# Patient Record
Sex: Female | Born: 1985 | Race: Black or African American | Hispanic: No | Marital: Single | Smoking: Former smoker
Health system: Southern US, Community
[De-identification: ages and names within clinical notes are randomized; demographics above are authoritative.]

## PROBLEM LIST (undated history)

## (undated) DIAGNOSIS — Z789 Other specified health status: Secondary | ICD-10-CM

## (undated) DIAGNOSIS — F419 Anxiety disorder, unspecified: Secondary | ICD-10-CM

## (undated) DIAGNOSIS — T7840XA Allergy, unspecified, initial encounter: Secondary | ICD-10-CM

## (undated) DIAGNOSIS — F32A Depression, unspecified: Secondary | ICD-10-CM

## (undated) HISTORY — DX: Depression, unspecified: F32.A

## (undated) HISTORY — PX: OTHER SURGICAL HISTORY: SHX169

## (undated) HISTORY — DX: Other specified health status: Z78.9

## (undated) HISTORY — DX: Allergy, unspecified, initial encounter: T78.40XA

## (undated) HISTORY — DX: Anxiety disorder, unspecified: F41.9

---
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
## 2005-03-27 ENCOUNTER — Inpatient Hospital Stay: Payer: Self-pay

## 2005-04-19 ENCOUNTER — Emergency Department: Payer: Self-pay | Admitting: Emergency Medicine

## 2005-05-25 ENCOUNTER — Observation Stay: Payer: Self-pay

## 2005-05-28 ENCOUNTER — Inpatient Hospital Stay: Payer: Self-pay

## 2005-12-10 ENCOUNTER — Emergency Department: Payer: Self-pay | Admitting: Emergency Medicine

## 2006-06-25 ENCOUNTER — Emergency Department: Payer: Self-pay | Admitting: Emergency Medicine

## 2007-03-28 ENCOUNTER — Emergency Department: Payer: Self-pay | Admitting: Emergency Medicine

## 2007-10-19 ENCOUNTER — Emergency Department: Payer: Self-pay | Admitting: Emergency Medicine

## 2008-03-21 ENCOUNTER — Emergency Department: Payer: Self-pay | Admitting: Emergency Medicine

## 2008-05-20 ENCOUNTER — Emergency Department: Payer: Self-pay | Admitting: Emergency Medicine

## 2011-04-16 ENCOUNTER — Emergency Department: Payer: Self-pay | Admitting: Emergency Medicine

## 2012-02-23 ENCOUNTER — Emergency Department: Payer: Self-pay | Admitting: *Deleted

## 2013-08-15 HISTORY — PX: EXCISION VAGINAL CYST: SHX5825

## 2014-03-21 DIAGNOSIS — N907 Vulvar cyst: Secondary | ICD-10-CM | POA: Insufficient documentation

## 2014-04-26 ENCOUNTER — Inpatient Hospital Stay: Payer: Self-pay | Admitting: Obstetrics and Gynecology

## 2014-04-26 LAB — CBC WITH DIFFERENTIAL/PLATELET
BASOS ABS: 0 10*3/uL (ref 0.0–0.1)
Basophil %: 0.6 %
Eosinophil #: 0.1 10*3/uL (ref 0.0–0.7)
Eosinophil %: 1.1 %
HCT: 30.3 % — ABNORMAL LOW (ref 35.0–47.0)
HGB: 9.7 g/dL — ABNORMAL LOW (ref 12.0–16.0)
LYMPHS PCT: 15.9 %
Lymphocyte #: 1.1 10*3/uL (ref 1.0–3.6)
MCH: 24.1 pg — AB (ref 26.0–34.0)
MCHC: 32.1 g/dL (ref 32.0–36.0)
MCV: 75 fL — ABNORMAL LOW (ref 80–100)
MONO ABS: 0.7 x10 3/mm (ref 0.2–0.9)
Monocyte %: 10.4 %
NEUTROS ABS: 5.1 10*3/uL (ref 1.4–6.5)
Neutrophil %: 72 %
Platelet: 284 10*3/uL (ref 150–440)
RBC: 4.04 10*6/uL (ref 3.80–5.20)
RDW: 14.3 % (ref 11.5–14.5)
WBC: 7 10*3/uL (ref 3.6–11.0)

## 2014-04-28 LAB — HEMATOCRIT: HCT: 27.3 % — AB (ref 35.0–47.0)

## 2014-07-16 DIAGNOSIS — Z862 Personal history of diseases of the blood and blood-forming organs and certain disorders involving the immune mechanism: Secondary | ICD-10-CM | POA: Insufficient documentation

## 2014-12-23 NOTE — H&P (Signed)
L&D Evaluation:  History:  HPI Pt is a 29 yo G3P2002 at 40.[redacted] weeks GA with an EDC of 04/21/14 who presents to L&D for IOL for a late term pregnancy. She reports +FM, denies vb or lof. Her pregnancy course is significant for obesity, MJ use until a + HPT, vulvar cyst that was removed in pregnancy, anemia, and frequent yeast infections. She is O+, VI, RI, Tdap given, and GBS positive.   Presents with other, IOL   Patient's Medical History obesity, anemia, PIH with G1   Patient's Surgical History other  arm surgery   Medications Pre Natal Vitamins  Iron   Allergies azithromycin   Social History MJ use prior to +HPT   Family History Non-Contributory   ROS:  ROS All systems were reviewed.  HEENT, CNS, GI, GU, Respiratory, CV, Renal and Musculoskeletal systems were found to be normal.   Exam:  Vital Signs stable   General no apparent distress   Mental Status clear   Chest clear   Heart normal sinus rhythm   Abdomen gravid, non-tender   Pelvic 3/75-80/-2   Mebranes Intact   FHT normal rate with no decels, 140's, +accels, no decels   Ucx regular, every 4 minutes   Skin dry, no lesions, no rashes   Lymph no lymphadenopathy   Impression:  Impression reactive NST, IOL for late term at 40.6 weeks   Plan:  Plan EFM/NST, antibiotics for GBBS prophylaxis, pitocin, anticipate svd   Follow Up Appointment need to schedule. in 6 weeks   Electronic Signatures: Breleigh Carpino, AnimatorCourtney (CNM)  (Signed 13-Sep-15 09:33)  Authored: L&D Evaluation   Last Updated: 13-Sep-15 09:33 by Jannet MantisSubudhi, Taliesin Hartlage (CNM)

## 2016-04-14 LAB — LIPID PANEL
CHOLESTEROL, TOTAL: 115 (ref 100–199)
HDL: 52 (ref 39–39)
LDL (calc): 56 (ref 0–99)
TRIGLYCERIDES: 36 (ref 0–149)
Total CHOL/HDL Ratio: 2.2 (ref 0.0–4.4)
VLDL: 7 mg/dL (ref 5–40)

## 2016-04-14 LAB — GLUCOSE, FASTING: Glucose: 84 (ref 65–99)

## 2016-04-14 LAB — CREATININE WITH EST GFR
Creatine, Serum: 0.8 (ref 0.57–1.00)
EGFR (African American): 114 (ref <59)
GFR CALC NON AF AMER: 99 (ref 59–59)

## 2016-04-14 LAB — HEMOGLOBIN A1C: Hemoglobin A1C: 5.2 (ref 4.8–5.6)

## 2016-06-01 ENCOUNTER — Encounter: Payer: Self-pay | Admitting: *Deleted

## 2016-06-01 ENCOUNTER — Ambulatory Visit (INDEPENDENT_AMBULATORY_CARE_PROVIDER_SITE_OTHER): Payer: 59 | Admitting: Family Medicine

## 2016-06-01 ENCOUNTER — Encounter: Payer: Self-pay | Admitting: Family Medicine

## 2016-06-01 ENCOUNTER — Encounter (INDEPENDENT_AMBULATORY_CARE_PROVIDER_SITE_OTHER): Payer: Self-pay

## 2016-06-01 VITALS — BP 121/68 | HR 80 | Temp 98.5°F | Resp 16 | Ht 66.0 in | Wt 200.0 lb

## 2016-06-01 DIAGNOSIS — Z720 Tobacco use: Secondary | ICD-10-CM | POA: Diagnosis not present

## 2016-06-01 DIAGNOSIS — Z862 Personal history of diseases of the blood and blood-forming organs and certain disorders involving the immune mechanism: Secondary | ICD-10-CM

## 2016-06-01 DIAGNOSIS — Z7689 Persons encountering health services in other specified circumstances: Secondary | ICD-10-CM | POA: Diagnosis not present

## 2016-06-01 DIAGNOSIS — J3089 Other allergic rhinitis: Secondary | ICD-10-CM | POA: Insufficient documentation

## 2016-06-01 DIAGNOSIS — N907 Vulvar cyst: Secondary | ICD-10-CM

## 2016-06-01 DIAGNOSIS — Z975 Presence of (intrauterine) contraceptive device: Secondary | ICD-10-CM | POA: Diagnosis not present

## 2016-06-01 DIAGNOSIS — Z716 Tobacco abuse counseling: Secondary | ICD-10-CM | POA: Diagnosis not present

## 2016-06-01 DIAGNOSIS — E669 Obesity, unspecified: Secondary | ICD-10-CM | POA: Diagnosis not present

## 2016-06-01 NOTE — Assessment & Plan Note (Signed)
Stable, controlled on claritin PRN

## 2016-06-01 NOTE — Assessment & Plan Note (Signed)
Stable, intermittent menstrual cycles. Interested in replacing Mirena IUD when due in 05/2019, advised to notify our office in advance for GYN referral

## 2016-06-01 NOTE — Patient Instructions (Addendum)
Thank you for coming in to clinic today.  1. Reminder for exercise and diet plan - Exercise 5 x days a week, 30 min, both cardio and strength - Diet with smaller frequent meals, higher protein and low carb, inc vegetables/fruits  2. Smoking Cessation plan - Taper down by 1 cig each week until off - 1 [id number], if want to speak to therapist and smoking counselor  Reminder TDap tetanus shot due in 2025 Mirena due to be swapped 05/2019 - will need referral to GYN (let us know few months in advance)  Please schedule a follow-up appointment with Dr. Althea CharonKaramalegos in 1 year for Annual Physical + Pap Smear, you may bring labs again.  If you have any other questions or concerns, please feel free to call the clinic or send a message through MyChart. You may also schedule an earlier appointment if necessary.  Saralyn PilarAlexander Karamalegos, DO [facility]

## 2016-06-01 NOTE — Progress Notes (Signed)
Subjective:    Patient ID: Margaret Potter, female    DOB: 02/09/1986, 30 y.o.   MRN: [id number]  Margaret Potter is a 30 y.o. female presenting on 06/01/2016 for Establish Care  Previously established for brief time at [facility] and [facility].  HPI  OBESITY BMI 31-32 - Reports chronic history of overweight, several years ago she weighed up to 300 lbs by report, intensive lifestyle modifications with diet / exercise, she was able to lose 110 lbs, down to 190 lbs. Following her most recent pregnancy, delivery 04/2014, she ultimately gained about 10 lbs back post-pregnancy, now around wt 200 lb - Today presents with failure of [facility] BMI biometric screening on 04/14/2016, had labs drawn, and BMI >30, she is interested in resuming lifestyle modifications to lose and maintain healthy weight - Interested to switch back to diet to smaller frequent meals, low carb, adding more vegetables/fruit to diet - She is ready to resume regular exercise 5 x week, 30 min, combination of cardio strength - Additional biometric measurements, height 66.5 inch, weight 200.0 lbs, BMI 31.8, Waist 33.5 in, BP 126/74  TOBACCO USE: - Active smoker, intermittently over past 5 years, now trying to quit. She feels ready to quit now - Currently about 4-5 cigs daily, and plans to self quit using taper method down by 1 cig daily per week, over about 4 weeks until quit. Not interested in NRT or medications. Has quit before smoke free for up to 2 years. - Admits to smoking as primary stress reliever. Does not like smell and taste - Failed [facility] biometrics for active smoker  H/o Anemia in pregnancy, Resolved - Reports prior history of some low blood counts with dx anemia during last pregnancy, reviewed chart with mild low Hgb in 01/2014 and resolution with normal Hgb in 07/2014 - Does not take any iron supplement  Seasonal and Environmental Allergies: - Chronic mild problem by report, does well with  intermittent claritin PRN   Contraception / OB GYN - G3P3, all NSVD. Did have complication with gestational HTN during 1st pregnancy only - Mirena IUD placed 05/2014 (s/p last pregnancy delivery 04/2014), will be due for replacement in 2020 - Controls menstrual cycles with infrequent bleeding small amount q 2-4 months  Health Maintenance: - Last pap smear, 09/2013 (normal), had abnormal pap x 1 (in 2012, had colposcopy and cervix biopsy, negative) - UTD on Flu shot, TDap 04/27/2014, next due 2025  History reviewed. No pertinent past medical history. Social History   Social History  . Marital status: Single    Spouse name: N/A  . Number of children: 3  . Years of education: College   Occupational History  . [employer] Employee    Social History Main Topics  . Smoking status: Current Every Day Smoker    Packs/day: 0.40    Years: 5.00    Types: Cigarettes  . Smokeless tobacco: Never Used     Comment: Quit prior 2 years, without treatment  . Alcohol use No  . Drug use: No  . Sexual activity: Yes    Birth control/ protection: IUD   Other Topics Concern  . Not on file   Social History Narrative  . No narrative on file   Family History  Problem Relation Age of Onset  . Arthritis Mother   . Cancer Mother   . Depression Mother   . Diabetes Mother   . Heart disease Mother   . Hyperlipidemia Mother   . Hypertension Mother   .  Stroke Mother   . Depression Father   . Ulcers Father   . Depression Brother   . Arthritis Maternal Grandmother   . Asthma Maternal Grandmother   . Cancer Maternal Grandmother   . Depression Maternal Grandmother   . Diabetes Maternal Grandmother   . Heart disease Maternal Grandmother   . Hypertension Maternal Grandmother   . Stroke Maternal Grandmother   . Vision loss Maternal Grandmother   . Arthritis Maternal Grandfather   . Alcohol abuse Maternal Grandfather   . Cancer Maternal Grandfather   . Hypertension Maternal Grandfather   . Stroke  Maternal Grandfather   . Heart attack Maternal Grandfather   . Arthritis Paternal Grandmother   . Diabetes Paternal Grandmother   . Hypertension Paternal Grandmother   . Vision loss Paternal Grandmother    No current outpatient prescriptions on file prior to visit.   No current facility-administered medications on file prior to visit.    Past Surgical History:  Procedure Laterality Date  . arm surgery Right    Question if hardware retained, unsure childhood surgery  . EXCISION VAGINAL CYST N/A 2015   Midline, chronic problem (inclusion cyst s/p prior suture repair from pregnancy)    Review of Systems  Constitutional: Negative for activity change, appetite change, chills, diaphoresis, fatigue, fever and unexpected weight change.  HENT: Negative for congestion, hearing loss and sinus pressure.   Eyes: Negative for visual disturbance.  Respiratory: Negative for cough, chest tightness and shortness of breath.   Cardiovascular: Negative for chest pain, palpitations and leg swelling.  Gastrointestinal: Negative for abdominal pain, constipation, diarrhea, nausea and vomiting.  Endocrine: Negative for cold intolerance, polydipsia and polyuria.  Genitourinary: Negative for dysuria, frequency and menstrual problem.  Musculoskeletal: Negative for arthralgias and neck pain.  Skin: Negative for rash.  Allergic/Immunologic: Positive for environmental allergies.  Neurological: Negative for dizziness, weakness, light-headedness, numbness and headaches.  Hematological: Negative for adenopathy.  Psychiatric/Behavioral: Negative for behavioral problems, dysphoric mood and sleep disturbance.   Per HPI unless specifically indicated above     Objective:    BP 121/68 (BP Location: Right Arm, Patient Position: Sitting, Cuff Size: Large)   Pulse 80   Temp 98.5 F (36.9 C) (Oral)   Resp 16   Ht _0  (1.676 m)   Wt 200 lb (90.7 kg)   BMI 32.28 kg/m   Wt Readings from Last 3 Encounters:    06/01/16 200 lb (90.7 kg)    Physical Exam  Constitutional: She is oriented to person, place, and time. She appears well-developed and well-nourished. No distress.  Well-appearing, comfortable, cooperative  HENT:  Head: Normocephalic and atraumatic.  Mouth/Throat: Oropharynx is clear and moist.  Eyes: Conjunctivae and EOM are normal. Pupils are equal, round, and reactive to light.  Neck: Normal range of motion. Neck supple. No thyromegaly present.  Cardiovascular: Normal rate, regular rhythm, normal heart sounds and intact distal pulses.   No murmur heard. Pulmonary/Chest: Effort normal and breath sounds normal. No respiratory distress. She has no wheezes. She has no rales.  Abdominal: Soft. She exhibits no distension.  Musculoskeletal: Normal range of motion. She exhibits no edema or tenderness.  Back normal without deformity or abnormal curvature.  Upper / Lower Extremities: - Normal muscle tone, strength bilateral upper extremities 5/5, lower extremities 5/5 - Bilateral shoulders, knees, wrist, ankles without deformity, tenderness, effusion - Normal Gait  Right Thumb No deformity of joint, erythema, edema, or ecchymosis. Non-tender to palpation. Negative Finklestein's test for tendinitis. Grip normal.  Lymphadenopathy:    She has no cervical adenopathy.  Neurological: She is alert and oriented to person, place, and time.  Skin: Skin is warm and dry. No rash noted. She is not diaphoretic.  Psychiatric: She has a normal mood and affect. Her behavior is normal.  Nursing note and vitals reviewed.  Reviewed results in Care Everywhere from 01/2014 and 07/2014 - During most recent pregnancy - 01/2014 with CBC - WBC 6.5, Hgb 10.5, HCT 31.5, MCV 83 - 07/2014 with CBC - WBC 5.9, Hgb 12.7, HCT 39, MCV 83  Recent Results (from the past 2160 hour(s))  Lipid panel     Status: None   Collection Time: 04/14/16 12:00 AM  Result Value Ref Range   Cholesterol, Total 115 100 - 199    Triglycerides 36 0 - 149   HDL 52 <39   VLDL 7 5 - 40 mg/dL   LDL (calc) 56 0 - 99   Total CHOL/HDL Ratio 2.2 0.0 - 4.4  Hemoglobin A1c     Status: None   Collection Time: 04/14/16 12:00 AM  Result Value Ref Range   Hemoglobin A1C 5.2 4.8 - 5.6  Creatinine with Est GFR     Status: None   Collection Time: 04/14/16 12:00 AM  Result Value Ref Range   Creatine, Serum 0.80 0.57 - 1.00   EGFR (Non-African Amer.) 99 <59   EGFR (African American) 114 <59  Glucose, fasting     Status: None   Collection Time: 04/14/16 12:00 AM  Result Value Ref Range   Glucose 84 65 - 99        Assessment & Plan:   Problem List Items Addressed This Visit    RESOLVED: Vulvar cyst   Tobacco use    Active smoker, 4-5 cigs daily. Prior quit for up to 2 years. Ready to quit. Start self taper plan 1 cig down weekly for 4 weeks to quit. Offered cessation resources, [place] QUITLINE Follow-up as needed. Completed [facility] Biometric paperwork, returned to patient.      Obesity (BMI 30.0-34.9)    BMI 31-32, without personal history of other risk factors, some fam history with DM, HTN, HLD. Prior significant weight gain up to 300 lb, was able to lose 110 lbs w/ diet+exercise - A1c 5.2, lipids completely normal  Plan: 1. Failed [facility] biometric screening, goal BMI < 30, discussed mutual plan to start Lifestyle modifications with diet smaller frequent meals, low carb, inc veg/fruit. Execise regular 5 x weekly, 30 min, cardio + strength. Completed paperwork and returned to patient for submission to [facility] 2. Future monitor labs per [facility] with routine yearly biometrics, if other abnormality consider adding chemistry 3. Follow-up 1 year      History of anemia    Resolved. Asymptomatic Initial dx during pregnancy only 01/2014, resolved on re-check postpartum 07/2014. Now on IUD, without menorrhagia Continue regular diet. No iron supplement at this time. Follow-up in future as needed      Environmental and  seasonal allergies    Stable, controlled on claritin PRN      Contraception, device intrauterine    Stable, intermittent menstrual cycles. Interested in replacing [place] IUD when due in 05/2019, advised to notify our office in advance for GYN referral       Other Visit Diagnoses    Encounter to establish care with new doctor    -  Primary   Encounter for smoking cessation counseling          Meds ordered  this encounter  Medications  . ibuprofen (ADVIL,MOTRIN) 200 MG tablet    Sig: Take 200-400 mg by mouth every 6 (six) hours as needed for headache or cramping.  . loratadine (CLARITIN) 10 MG tablet    Sig: Take 10 mg by mouth daily as needed for allergies.      Follow up plan: Return in about 1 year (around 06/01/2017) for annual physical.  Nobie Putnam, [facility] 06/01/2016, 3:39 PM

## 2016-06-01 NOTE — Assessment & Plan Note (Signed)
Resolved. Asymptomatic Initial dx during pregnancy only 01/2014, resolved on re-check postpartum 07/2014. Now on IUD, without menorrhagia Continue regular diet. No iron supplement at this time. Follow-up in future as needed

## 2016-06-01 NOTE — Assessment & Plan Note (Signed)
Active smoker, 4-5 cigs daily. Prior quit for up to 2 years. Ready to quit. Start self taper plan 1 cig down weekly for 4 weeks to quit. Offered cessation resources, [place] QUITLINE Follow-up as needed. Completed [facility] Biometric paperwork, returned to patient.

## 2016-06-01 NOTE — Assessment & Plan Note (Signed)
BMI 31-32, without personal history of other risk factors, some fam history with DM, HTN, HLD. Prior significant weight gain up to 300 lb, was able to lose 110 lbs w/ diet+exercise - A1c 5.2, lipids completely normal  Plan: 1. Failed [facility] biometric screening, goal BMI < 30, discussed mutual plan to start Lifestyle modifications with diet smaller frequent meals, low carb, inc veg/fruit. Execise regular 5 x weekly, 30 min, cardio + strength. Completed paperwork and returned to patient for submission to [facility] 2. Future monitor labs per [facility] with routine yearly biometrics, if other abnormality consider adding chemistry 3. Follow-up 1 year

## 2017-01-19 ENCOUNTER — Ambulatory Visit (INDEPENDENT_AMBULATORY_CARE_PROVIDER_SITE_OTHER): Payer: Managed Care, Other (non HMO) | Admitting: Nurse Practitioner

## 2017-01-19 ENCOUNTER — Encounter: Payer: Self-pay | Admitting: Nurse Practitioner

## 2017-01-19 VITALS — BP 112/58 | HR 93 | Temp 98.8°F | Ht 65.5 in | Wt 208.2 lb

## 2017-01-19 DIAGNOSIS — J029 Acute pharyngitis, unspecified: Secondary | ICD-10-CM

## 2017-01-19 DIAGNOSIS — J019 Acute sinusitis, unspecified: Secondary | ICD-10-CM | POA: Diagnosis not present

## 2017-01-19 LAB — POCT RAPID STREP A (OFFICE): Rapid Strep A Screen: NEGATIVE

## 2017-01-19 MED ORDER — FLUTICASONE PROPIONATE 50 MCG/ACT NA SUSP: 2.0000 | Freq: Every day | NASAL | 6 refills | Status: DC

## 2017-01-19 MED ORDER — IPRATROPIUM BROMIDE 0.06 % NA SOLN: 2.0000 | Freq: Four times a day (QID) | NASAL | 0 refills | Status: DC

## 2017-01-19 MED ORDER — AMOXICILLIN 500 MG PO TABS: 500.0000 mg | ORAL_TABLET | Freq: Two times a day (BID) | ORAL | 0 refills | Status: AC

## 2017-01-19 NOTE — Patient Instructions (Addendum)
Shaquasha, Thank you for coming in to clinic today.  1. You likely have sinusitis. - START taking amoxicillin 500 mg twice daily (about every 12 hours) if you still feel this bad tomorrow or worse. -   It may be viral so please wait to start your antibiotic. - Start Atrovent nasal spray decongestant 2 sprays each nostril up to 4 times daily for 5-7 days - Continue anti-histamine loratadine 10mg  daily - also can use Flonase 2 sprays each nostril daily for up to 4-6 weeks - If congestion is worse, start OTC Mucinex (or may try Mucinex-DM for cough) up to 7-10 days then stop - Drink plenty of fluids to improve congestion - You may try over the counter Nasal Saline spray (Simply Saline, Ocean Spray) as needed to reduce congestion. - Drink warm herbal tea with honey for sore throat or sore throat lozenges. - Start taking Tylenol extra strength 1 to 2 tablets every 6-8 hours for aches or fever/chills for next few days as needed.  Do not take more than 3,000 mg in 24 hours from all medicines.  May take Ibuprofen as well if tolerated 200-400mg  every 8 hours as needed.  Please schedule a follow-up appointment with Wilhelmina McardleLauren Jaiana Sheffer, AGNP to Return 5-7 days if symptoms worsen or fail to improve.  If you have any other questions or concerns, please feel free to call the clinic or send a message through MyChart. You may also schedule an earlier appointment if necessary.  Wilhelmina McardleLauren Gavrielle Streck, DNP, AGNP-BC Adult Gerontology Nurse Practitioner [facility], [facility]

## 2017-01-19 NOTE — Progress Notes (Signed)
I have reviewed this encounter including the documentation in this note and/or discussed this patient with the provider, Wilhelmina McardleLauren Kennedy, AGPCNP-BC. I am certifying that I agree with the content of this note as supervising physician.  Saralyn PilarAlexander Keymari Sato, DO [facility] 01/19/2017, 5:44 PM

## 2017-01-19 NOTE — Progress Notes (Signed)
Subjective:    Patient ID: Margaret Potter, female    DOB: Mar 08, 1986, 31 y.o.   MRN: [id number]  Margaret Potter is a 31 y.o. female presenting on 01/19/2017 for Sinusitis (sore throat, hoarsness, sinus pressure, back aches, sweats, and leg aches x 2-3 days )   HPI  URI symptoms Symptom onset was Monday with drainage and took allergy medicine.  Tuesday worsening w/ ongoing worsening of symptoms each day.  Current symptoms include sore throat, hoarseness, hip/low back/leg aches, sweats that come and go, and malaise.  Last night she had a night sweat so bad she had to take a shower.  No tooth/jaw pain, ear pressure or fullness today, nausea, vomiting, diarrhea, or constipation.  She does note once sick contact of close family member.  Social History  Substance Use Topics  . Smoking status: Current Every Day Smoker    Packs/day: 0.25    Years: 5.00    Types: Cigarettes  . Smokeless tobacco: Never Used     Comment: Quit prior 2 years, without treatment  . Alcohol use No    Review of Systems Per HPI unless specifically indicated above     Objective:    BP (!) 112/58 (BP Location: Left Arm, Patient Position: Sitting, Cuff Size: Normal)   Pulse 93   Temp 98.8 F (37.1 C) (Oral)   Ht 5' 5.5" (1.664 m)   Wt 208 lb 3.2 oz (94.4 kg)   LMP 01/12/2017   SpO2 100%   BMI 34.12 kg/m    Wt Readings from Last 3 Encounters:  01/19/17 208 lb 3.2 oz (94.4 kg)  06/01/16 200 lb (90.7 kg)    Physical Exam  Constitutional: She is oriented to person, place, and time. She appears well-developed and well-nourished. She appears ill.  HENT:  Head: Normocephalic and atraumatic.  Right Ear: Hearing, tympanic membrane, external ear and ear canal normal.  Left Ear: Hearing, tympanic membrane, external ear and ear canal normal.  Nose: Mucosal edema and rhinorrhea present. Right sinus exhibits maxillary sinus tenderness and frontal sinus tenderness. Left sinus exhibits maxillary sinus  tenderness and frontal sinus tenderness.  Mouth/Throat: Uvula is midline, oropharynx is clear and moist and mucous membranes are normal.  hoarse  Eyes: Conjunctivae are normal. Pupils are equal, round, and reactive to light.  Neck: Normal range of motion. Neck supple. No JVD present. No tracheal deviation present.  Cardiovascular: Normal rate, regular rhythm, normal heart sounds and intact distal pulses.   Pulmonary/Chest: Breath sounds normal. No respiratory distress.  Lymphadenopathy:    She has cervical adenopathy.  Neurological: She is alert and oriented to person, place, and time.  Skin: Skin is warm. She is diaphoretic.  Psychiatric: She has a normal mood and affect. Her behavior is normal. Judgment and thought content normal.  Vitals reviewed.  Results for orders placed or performed in visit on 01/19/17  POCT rapid strep A  Result Value Ref Range   Rapid Strep A Screen Negative Negative      Assessment & Plan:   Problem List Items Addressed This Visit    None    Visit Diagnoses    1. Sore throat    -  Primary Symptom not associated with strep throat.  See #2 for plan.   Relevant Orders   POCT rapid strep A (Completed)   2. Acute rhinosinusitis       Acute illness.  Symptoms worsening each day. Consistent with possible bacterial sinusitis illness x 3 days with several  known sick contacts.  Identifiable focal infections of pan-sinuses.  Plan: 1. Reassurance provided of possible viral infection, but w/ severity of symptoms appears as if bacterial.  Wait until tomorrow to start antibiotics if no improvement or continued worsening. Start amoxicillin 500 mg x 10 days if symptoms persist at current severity or worsen tomorrow. - Start Atrovent nasal spray decongestant 2 sprays each nostril up to 4 times daily for 5-7 days - Continue anti-histamine Loratadine 10mg  daily,  - also can use Flonase 2 sprays each nostril daily for up to 4-6 weeks - Start Mucinex-DM OTC up to 7-10 days  then stop - Start sore throat lozenges if needed. 2. Supportive care with nasal saline, warm herbal tea with honey, 3. Improve hydration 4. Tylenol / Motrin PRN fevers 5. Return criteria given.    Relevant Medications   amoxicillin (AMOXIL) 500 MG tablet   ipratropium (ATROVENT) 0.06 % nasal spray   fluticasone (FLONASE) 50 MCG/ACT nasal spray      Meds ordered this encounter  Medications  . levonorgestrel (MIRENA) 20 MCG/24HR IUD    Sig: 1 each by Intrauterine route once.  Marland Kitchen. amoxicillin (AMOXIL) 500 MG tablet    Sig: Take 1 tablet (500 mg total) by mouth 2 (two) times daily.    Dispense:  20 tablet    Refill:  0    Order Specific Question:   Supervising Provider    Answer:   Smitty CordsKARAMALEGOS, ALEXANDER J [[id number]]  . ipratropium (ATROVENT) 0.06 % nasal spray    Sig: Place 2 sprays into both nostrils 4 (four) times daily.    Dispense:  15 mL    Refill:  0    Order Specific Question:   Supervising Provider    Answer:   Smitty CordsKARAMALEGOS, ALEXANDER J [[id number]]  . fluticasone (FLONASE) 50 MCG/ACT nasal spray    Sig: Place 2 sprays into both nostrils daily.    Dispense:  16 g    Refill:  6    Order Specific Question:   Supervising Provider    Answer:   Smitty CordsKARAMALEGOS, ALEXANDER J [[id number]]    Follow up plan: Return 5-7 days if symptoms worsen or fail to improve.    Wilhelmina McardleLauren Lachina Salsberry, DNP, AGPCNP-BC Adult Gerontology Primary Care Nurse Practitioner [facility] 01/19/2017, 5:26 PM

## 2017-03-23 ENCOUNTER — Ambulatory Visit: Payer: 59 | Admitting: Family Medicine

## 2017-03-24 ENCOUNTER — Ambulatory Visit (INDEPENDENT_AMBULATORY_CARE_PROVIDER_SITE_OTHER): Payer: Managed Care, Other (non HMO) | Admitting: Family Medicine

## 2017-03-24 ENCOUNTER — Encounter: Payer: Self-pay | Admitting: Family Medicine

## 2017-03-24 VITALS — BP 116/64 | HR 79 | Temp 98.4°F | Resp 16 | Ht 65.5 in | Wt 207.8 lb

## 2017-03-24 DIAGNOSIS — Z716 Tobacco abuse counseling: Secondary | ICD-10-CM

## 2017-03-24 DIAGNOSIS — Z720 Tobacco use: Secondary | ICD-10-CM

## 2017-03-24 DIAGNOSIS — Z026 Encounter for examination for insurance purposes: Secondary | ICD-10-CM | POA: Diagnosis not present

## 2017-03-24 DIAGNOSIS — Z6834 Body mass index (BMI) 34.0-34.9, adult: Secondary | ICD-10-CM | POA: Diagnosis not present

## 2017-03-24 DIAGNOSIS — E669 Obesity, unspecified: Secondary | ICD-10-CM | POA: Diagnosis not present

## 2017-03-24 NOTE — Progress Notes (Signed)
Subjective:    Patient ID: Margaret Potter, female    DOB: 10-Jun-1986, 31 y.o.   MRN: [id number]  Margaret Potter is a 31 y.o. female presenting on 03/24/2017 for Weight Gain and Nicotine Dependence   HPI   Insurance Paperwork today BMI / Smoking  Weight Gain / Obesity BMI >34 - She has BMI goal of < 29 for work insurance for healthy lifestyle, she did not meet this goal again this year now with BMI 34. She has plans to make changes and has paperwork today to certify this plan. - Reports that she started intensive lifestyle changes started 1 week ago with eliminated sodas (no more sugary drinks), now drinking mostly water, adjusted diet now with smaller portions throughout the day, eats something at least every 2 hours, increased fruits/vegetables and increased physical activity with regular walking goal up to 1 x daily for cardio, also plans to go to gym or do other cardio 3x weekly at goal. - Down 6 lbs in 1 week with plan - Denies any abdominal pain, nausea, vomiting, swelling, dyspnea or exertional symptoms  TOBACCO USE: - Last visit with me 06/01/16, for initial visit for same problem with tobacco abuse, at that time she was at 4-5 cigs daily and planned taper down and quit in 4 weeks but she was unsuccessful due to life stressors, says that smoking is first thing she goes to with stress. See prior notes for background information. - Interval update with never quit since last visit, has continued to smoke at same level, she tried [place] [facility]uit[facility] and had a quit coach. - Today patient reports still smoking 4-5 cigs daily some days are less, she is now motivated to use quit coach again and set a new quit date, she feels that stress is biggest factor to cause her to smoke ,can work 8-12 hour shift without needing cigarette but if stressed it is her coping mechanism. She has plans to find other things to keep herself busy, especially with exercise regimen. Still not as interested in meds or  NRT, but interested to learn more about meds in case she needs them in future. - Prior quit up to 2 years before - Failed [facility] biometrics for active smoker - Denies cough, dyspnea   Social History  Substance Use Topics  . Smoking status: Current Every Day Smoker    Packs/day: 0.25    Years: 5.00    Types: Cigarettes  . Smokeless tobacco: Current User     Comment: Quit prior 2 years, without treatment  . Alcohol use No    Review of Systems Per HPI unless specifically indicated above     Objective:    BP 116/64   Pulse 79   Temp 98.4 F (36.9 C) (Oral)   Resp 16   Ht 5' 5.5" (1.664 m)   Wt 207 lb 12.8 oz (94.3 kg)   BMI 34.05 kg/m   Wt Readings from Last 3 Encounters:  03/24/17 207 lb 12.8 oz (94.3 kg)  01/19/17 208 lb 3.2 oz (94.4 kg)  06/01/16 200 lb (90.7 kg)    Physical Exam  Constitutional: She is oriented to person, place, and time. She appears well-developed and well-nourished. No distress.  Well-appearing, comfortable, cooperative  HENT:  Head: Normocephalic and atraumatic.  Mouth/Throat: Oropharynx is clear and moist.  Eyes: Conjunctivae are normal. Right eye exhibits no discharge. Left eye exhibits no discharge.  Cardiovascular: Normal rate.   Pulmonary/Chest: Effort normal.  Musculoskeletal: She exhibits no  edema.  Neurological: She is alert and oriented to person, place, and time.  Skin: Skin is warm and dry. No rash noted. She is not diaphoretic. No erythema.  Psychiatric: She has a normal mood and affect. Her behavior is normal.  Well groomed, good eye contact, normal speech and thoughts  Nursing note and vitals reviewed.      Assessment & Plan:   Problem List Items Addressed This Visit    Tobacco use - Primary    Active smoker, 4-5 cigs daily. Prior quit for up to 2 years. Ready again to quit. She will resume self taper plan and contact her quit coach through Quitline or work, pick new quit date within 1 month. Counseling on Buproprion and  Chantix today, she will consider these options if request to start will notify office in interim Follow-up as scheduled for annual phys on 06/06/17, may monitor smoking progress at that time or start med if need  Discussion today >10 minutes specifically on counseling on risks of tobacco use, complications, treatment, smoking cessation.      Obesity (BMI 30.0-34.9)    BMI 34 (goal of < 30 for [facility] biometrics), motivated and lost 6 lb in 1 week with improved diet, no sodas, smaller meals, walking daily, plans to add gym - Fam hx DM, HTN, HLD. Prior significant weight gain up to 300 lb, was able to lose 110 lbs w/ diet+exercise - A1c 5.2, lipids normal range, reviewed biometrics  Plan: 1. Failed [facility] biometric screening, goal BMI < 30, discussed mutual plan to start Lifestyle modifications with diet smaller frequent meals, low carb, inc veg/fruit. Execise regular daily with walking, and plan gym cardio/str 3x weekly 2. Future monitor labs per [facility] with routine yearly biometrics, if other abnormality consider adding chemistry 3. Follow-up 05/2017 as scheduled, may add other labs       Other Visit Diagnoses    Encounter for smoking cessation counseling          No orders of the defined types were placed in this encounter.   Follow up plan: Return in about 2 months (around 06/06/2017) for physical, already scheduled.  Completed paperwork and returned to patient for submission to [facility]  Saralyn PilarAlexander Carver Murakami, DO [facility] 03/24/2017, 9:05 AM

## 2017-03-24 NOTE — Patient Instructions (Addendum)
Thank you for coming to the clinic today.   1. Keep up the great work with your diet/exercise lifestyle plan. - Eliminate sodas/sugary drinks, more water - Smaller frequent portions - Start walking daily for exercise - May add gym or other exercises / cardio up to 3x weekly, may try low impact elliptical machines or other at gym other than running  2. For smoking  1 800-QUIT NOW  For your smoking cessation, here is the plan: - Start Wellbutrin 150mg  tablets - take 1 tab daily for 3 days - Then, start taking 1 tab TWICE daily (about every 12 hours). Continue this for about 7 weeks. - To quit smoking - wait to start this treatment until you are mentally ready to quit. - Choose a quit date in the future. Start taking the medicine about 1-2 weeks away from your quit date. Reduce the number of cigarettes daily until you eventually QUIT completely on your quit date. Continue taking the Wellbutrin twice daily for total 7 weeks, we may continue for longer.  Please schedule a Follow-up Appointment to: Return in about 2 months (around 06/06/2017) for physical, already scheduled.  If you have any other questions or concerns, please feel free to call the clinic or send a message through MyChart. You may also schedule an earlier appointment if necessary.  Additionally, you may be receiving a survey about your experience at our clinic within a few days to 1 week by e-mail or mail. We value your feedback.  Saralyn PilarAlexander Karamalegos, DO [facility]

## 2017-03-24 NOTE — Assessment & Plan Note (Addendum)
Active smoker, 4-5 cigs daily. Prior quit for up to 2 years. Ready again to quit. She will resume self taper plan and contact her quit coach through Quitline or work, pick new quit date within 1 month. Counseling on Buproprion and Chantix today, she will consider these options if request to start will notify office in interim Follow-up as scheduled for annual phys on 06/06/17, may monitor smoking progress at that time or start med if need  Discussion today >10 minutes specifically on counseling on risks of tobacco use, complications, treatment, smoking cessation.

## 2017-03-24 NOTE — Assessment & Plan Note (Signed)
BMI 34 (goal of < 30 for [facility] biometrics), motivated and lost 6 lb in 1 week with improved diet, no sodas, smaller meals, walking daily, plans to add gym - Fam hx DM, HTN, HLD. Prior significant weight gain up to 300 lb, was able to lose 110 lbs w/ diet+exercise - A1c 5.2, lipids normal range, reviewed biometrics  Plan: 1. Failed [facility] biometric screening, goal BMI < 30, discussed mutual plan to start Lifestyle modifications with diet smaller frequent meals, low carb, inc veg/fruit. Execise regular daily with walking, and plan gym cardio/str 3x weekly 2. Future monitor labs per [facility] with routine yearly biometrics, if other abnormality consider adding chemistry 3. Follow-up 05/2017 as scheduled, may add other labs

## 2017-04-05 ENCOUNTER — Encounter: Payer: Self-pay | Admitting: Nurse Practitioner

## 2017-04-05 ENCOUNTER — Ambulatory Visit (INDEPENDENT_AMBULATORY_CARE_PROVIDER_SITE_OTHER): Payer: Managed Care, Other (non HMO) | Admitting: Nurse Practitioner

## 2017-04-05 VITALS — BP 109/63 | HR 88 | Temp 98.9°F | Ht 65.5 in | Wt 208.0 lb

## 2017-04-05 DIAGNOSIS — K648 Other hemorrhoids: Secondary | ICD-10-CM | POA: Diagnosis not present

## 2017-04-05 DIAGNOSIS — B3731 Acute candidiasis of vulva and vagina: Secondary | ICD-10-CM

## 2017-04-05 DIAGNOSIS — B373 Candidiasis of vulva and vagina: Secondary | ICD-10-CM | POA: Diagnosis not present

## 2017-04-05 DIAGNOSIS — Z0000 Encounter for general adult medical examination without abnormal findings: Secondary | ICD-10-CM

## 2017-04-05 DIAGNOSIS — Z803 Family history of malignant neoplasm of breast: Secondary | ICD-10-CM | POA: Diagnosis not present

## 2017-04-05 MED ORDER — FLUCONAZOLE 150 MG PO TABS: 150.0000 mg | ORAL_TABLET | Freq: Once | ORAL | 0 refills | Status: AC

## 2017-04-05 NOTE — Patient Instructions (Addendum)
Margaret Potter, Thank you for coming in to clinic today.  1. FOr your yeast infection:  - take diflucan 150 mg today then repeat in 3 days (on Saturday).  2. For your hemorrhoid: - tucks/witch hazel pad - apply and leave in place 10-15 mins twice daily. - preparation h - for itching and pain.  Please schedule a follow-up appointment with [name], AGNP. Return in about 1 year (around 04/05/2018) for annual physical.  If you have any other questions or concerns, please feel free to call the clinic or send a message through MyChart. You may also schedule an earlier appointment if necessary.  You will receive a survey after today's visit either digitally by e-mail or paper by [employer]. Your experiences and feedback matter to [place].  Please respond so we know how we are doing as we provide care for you.   [name], DNP, AGNP-BC Adult Gerontology Nurse Practitioner [facility], [facility]   Hemorrhoids Hemorrhoids are swollen veins in and around the rectum or anus. There are two types of hemorrhoids:  Internal hemorrhoids. These occur in the veins that are just inside the rectum. They may poke through to the outside and become irritated and painful.  External hemorrhoids. These occur in the veins that are outside of the anus and can be felt as a painful swelling or hard lump near the anus.  Most hemorrhoids do not cause serious problems, and they can be managed with home treatments such as diet and lifestyle changes. If home treatments do not help your symptoms, procedures can be done to shrink or remove the hemorrhoids. What are the causes? This condition is caused by increased pressure in the anal area. This pressure may result from various things, including:  Constipation.  Straining to have a bowel movement.  Diarrhea.  Pregnancy.  Obesity.  Sitting for long periods of time.  Heavy lifting or other activity that causes you to strain.  Anal sex.  What are  the signs or symptoms? Symptoms of this condition include:  Pain.  Anal itching or irritation.  Rectal bleeding.  Leakage of stool (feces).  Anal swelling.  One or more lumps around the anus.  How is this diagnosed? This condition can often be diagnosed through a visual exam. Other exams or tests may also be done, such as:  Examination of the rectal area with a gloved hand (digital rectal exam).  Examination of the anal canal using a small tube (anoscope).  A blood test, if you have lost a significant amount of blood.  A test to look inside the colon (sigmoidoscopy or colonoscopy).  How is this treated? This condition can usually be treated at home. However, various procedures may be done if dietary changes, lifestyle changes, and other home treatments do not help your symptoms. These procedures can help make the hemorrhoids smaller or remove them completely. Some of these procedures involve surgery, and others do not. Common procedures include:  Rubber band ligation. Rubber bands are placed at the base of the hemorrhoids to cut off the blood supply to them.  Sclerotherapy. Medicine is injected into the hemorrhoids to shrink them.  Infrared coagulation. A type of light energy is used to get rid of the hemorrhoids.  Hemorrhoidectomy surgery. The hemorrhoids are surgically removed, and the veins that supply them are tied off.  Stapled hemorrhoidopexy surgery. A circular stapling device is used to remove the hemorrhoids and use staples to cut off the blood supply to them.  Follow these  instructions at home: Eating and drinking  Eat foods that have a lot of fiber in them, such as whole grains, beans, nuts, fruits, and vegetables. Ask your health care provider about taking products that have added fiber (fiber supplements).  Drink enough fluid to keep your urine clear or pale yellow. Managing pain and swelling  Take warm sitz baths for 20 minutes, 3-4 times a day to ease  pain and discomfort.  If directed, apply ice to the affected area. Using ice packs between sitz baths may be helpful. ? Put ice in a plastic bag. ? Place a towel between your skin and the bag. ? Leave the ice on for 20 minutes, 2-3 times a day. General instructions  Take over-the-counter and prescription medicines only as told by your health care provider.  Use medicated creams or suppositories as told.  Exercise regularly.  Go to the bathroom when you have the urge to have a bowel movement. Do not wait.  Avoid straining to have bowel movements.  Keep the anal area dry and clean. Use wet toilet paper or moist towelettes after a bowel movement.  Do not sit on the toilet for long periods of time. This increases blood pooling and pain. Contact a health care provider if:  You have increasing pain and swelling that are not controlled by treatment or medicine.  You have uncontrolled bleeding.  You have difficulty having a bowel movement, or you are unable to have a bowel movement.  You have pain or inflammation outside the area of the hemorrhoids. This information is not intended to replace advice given to you by your health care provider. Make sure you discuss any questions you have with your health care provider. Document Released: 07/29/2000 Document Revised: 12/30/2015 Document Reviewed: 04/15/2015 Elsevier Interactive Patient Education  2017 [employer].

## 2017-04-05 NOTE — Progress Notes (Signed)
Subjective:    Patient ID: Margaret Potter, female    DOB: [birth year], 31 y.o.   MRN: [id number]  Margaret Potter is a 31 y.o. female presenting on 04/05/2017 for Annual Exam   HPI Annual Physical Exam Patient has been feeling well.  They have acute concerns today for boil or hemorrhoid on rectum started 2 days ago No bleeding when wiping for BM.  NO pain until last night and had pain/discomfort w/ BM.  Sleeps 5-6 hours per night uninterrupted w/ occasional interruption for restroom.  HEALTH MAINTENANCE: Weight/BMI: gain of 8 lbs in last year Physical activity: increasing walking to 3 miles 4 days per week Diet: reducing sugary drinks, sweets, pork.  Rarely fried foods.   Seatbelt: always Sunscreen: never PAP: due today - last pap normal  VACCINES: Tetanus: 04/2014 Gardasil: no  No past medical history on file. Past Surgical History:  Procedure Laterality Date  . arm surgery Right    Question if hardware retained, unsure childhood surgery  . EXCISION VAGINAL CYST N/A 2015   Midline, chronic problem (inclusion cyst s/p prior suture repair from pregnancy)   Social History   Social History  . Marital status: Single    Spouse name: N/A  . Number of children: 3  . Years of education: College   Occupational History  . [employer] Employee    Social History Main Topics  . Smoking status: Current Every Day Smoker    Packs/day: 0.25    Years: 5.00    Types: Cigarettes  . Smokeless tobacco: Current User     Comment: Quit prior 2 years, without treatment  . Alcohol use No  . Drug use: No  . Sexual activity: Yes    Birth control/ protection: IUD   Other Topics Concern  . Not on file   Social History Narrative  . No narrative on file   Family History  Problem Relation Age of Onset  . Arthritis Mother   . Cancer Mother   . Depression Mother   . Diabetes Mother   . Heart disease Mother   . Hyperlipidemia Mother   . Hypertension Mother   . Stroke Mother   .  Depression Father   . Ulcers Father   . Depression Brother   . Arthritis Maternal Grandmother   . Asthma Maternal Grandmother   . Cancer Maternal Grandmother   . Depression Maternal Grandmother   . Diabetes Maternal Grandmother   . Heart disease Maternal Grandmother   . Hypertension Maternal Grandmother   . Stroke Maternal Grandmother   . Vision loss Maternal Grandmother   . Arthritis Maternal Grandfather   . Alcohol abuse Maternal Grandfather   . Cancer Maternal Grandfather   . Hypertension Maternal Grandfather   . Stroke Maternal Grandfather   . Heart attack Maternal Grandfather   . Arthritis Paternal Grandmother   . Diabetes Paternal Grandmother   . Hypertension Paternal Grandmother   . Vision loss Paternal Grandmother    Current Outpatient Prescriptions on File Prior to Visit  Medication Sig  . fluticasone (FLONASE) 50 MCG/ACT nasal spray Place 2 sprays into both nostrils daily.  Marland Kitchen ibuprofen (ADVIL,MOTRIN) 200 MG tablet Take 200-400 mg by mouth every 6 (six) hours as needed for headache or cramping.  Marland Kitchen ipratropium (ATROVENT) 0.06 % nasal spray Place 2 sprays into both nostrils 4 (four) times daily.  Marland Kitchen levonorgestrel (MIRENA) 20 MCG/24HR IUD 1 each by Intrauterine route once.  . loratadine (CLARITIN) 10 MG tablet Take 10 mg by  mouth daily as needed for allergies.   No current facility-administered medications on file prior to visit.     Review of Systems  All other systems reviewed and are negative.  Per HPI unless specifically indicated above      Objective:    BP 109/63 (BP Location: Right Arm, Patient Position: Sitting, Cuff Size: Large)   Pulse 88   Temp 98.9 F (37.2 C) (Oral)   Ht 5' 5.5" (1.664 m)   Wt 208 lb (94.3 kg)   BMI 34.09 kg/m   Wt Readings from Last 3 Encounters:  04/05/17 208 lb (94.3 kg)  03/24/17 207 lb 12.8 oz (94.3 kg)  01/19/17 208 lb 3.2 oz (94.4 kg)    Physical Exam  General - obese, well-appearing, NAD HEENT - Normocephalic,  atraumatic, PERRL, EOMI, patent nares w/o congestion, oropharynx clear, MMM Neck - supple, non-tender, no LAD, no thyromegaly Heart - RRR, no murmurs heard Lungs - Clear throughout all lobes, no wheezing, crackles, or rhonchi. Normal work of breathing. Breast - Normal exam w/ symmetric breasts, no mass, no nipple discharge, no skin changes or tenderness.  Abdomen - soft, NTND, no masses, no hepatosplenomegaly, active bowel sounds GU - Normal external female genitalia without lesions or fusion. Vaginal canal without lesions. Normal appearing cervix without lesions or friability. 2 black IUD strings visualized, increased, thick white discharge on exam. Bimanual exam without adnexal masses, enlarged uterus, or cervical motion tenderness. Extremeties - non-tender, no edema, cap refill < 2 seconds, peripheral pulses intact +2 bilaterally Skin - warm, dry, no rashes Neuro - awake, alert, oriented x3, CN II-X intact, intact muscle strength 5/5 bilaterally, intact distal sensation to light touch, normal coordination, normal gait Psych - Normal mood and affect, normal behavior   Results for orders placed or performed in visit on 01/19/17  POCT rapid strep A  Result Value Ref Range   Rapid Strep A Screen Negative Negative      Assessment & Plan:   Problem List Items Addressed This Visit    None    Visit Diagnoses    Annual physical exam    -  Primary   Relevant Orders   CBC with Differential/Platelet (Completed)   TSH (Completed)   Comprehensive metabolic panel (Completed)   Pap, ThinPrep rflx HPV with CT/GC (Completed)   Vaginal candidiasis     Pt reports is chronic problem and has increased discharge r/t yeast then occasionally BV.   Plan: 1. POCT wet prep today indicates yeast infection. 2. Treat w/ diflucan 1 tablet today, then repeat in 3 days. 3. Follow up as needed.   Relevant Orders   POCT Wet Prep [facility]) (Completed)   Other hemorrhoids     External hemorrhoid engorged  today w/ resolution.  Pt notes improvement and is not currently causing irritation.  Plan: 1. Reviewed precautions to prevent flare in future.   2. Can use preparation H or Tucks pads for relief. 3. Follow up as needed.       Family hx-breast malignancy     Pt w/ family history of breast cancer.  No living relative w/ breast cancer available for testing. Pt desires to discuss genetic testing.  Plan: 1. Mutual decision making discussion held about whether pt would want screening or care to change based on results of test.  Pt understands a positive BRCA test could change care and cause anxiety. 2. Proceed w/ BRCA test. 3. Follow up once results received.   Relevant Orders   BRCAssure  Comprehensive Test      Meds ordered this encounter  Medications  . fluconazole (DIFLUCAN) 150 MG tablet    Sig: Take 1 tablet (150 mg total) by mouth once. Repeat in 3 days    Dispense:  2 tablet    Refill:  0    Order Specific Question:   Supervising Provider    Answer:   Olin Hauser [[id number]]      Follow up plan: Return in about 1 year (around 04/05/2018) for annual physical.  Cassell Smiles, DNP, AGPCNP-BC Adult Gerontology Primary Care Nurse Practitioner [facility] 04/05/2017, 9:14 AM

## 2017-04-07 LAB — POCT WET PREP (WET MOUNT): Trichomonas Wet Prep HPF POC: ABSENT

## 2017-04-07 NOTE — Progress Notes (Signed)
I have reviewed this encounter including the documentation in this note and/or discussed this patient with the provider, [name], AGPCNP-BC. I am certifying that I agree with the content of this note as supervising physician.  Saralyn Pilar, DO [facility] 04/07/2017, 4:55 PM

## 2017-04-11 ENCOUNTER — Encounter: Payer: Self-pay | Admitting: Nurse Practitioner

## 2017-04-11 LAB — PAP, THINPREP RFLX HPV WITH CT/GC
Chlamydia, Nuc. Acid Amp: NEGATIVE
Gonococcus, Nuc. Acid Amp: NEGATIVE
PAP Smear Comment: 0

## 2017-04-11 NOTE — Telephone Encounter (Signed)
Can one of you call to [facility] to add on the HPV testing? Pt wants to opt into every 5 years w/ negative HPV.  Thanks

## 2017-04-14 LAB — CBC WITH DIFFERENTIAL/PLATELET
Basophils Absolute: 0 10*3/uL (ref 0.0–0.2)
Basos: 0 %
EOS (ABSOLUTE): 0.1 10*3/uL (ref 0.0–0.4)
Eos: 1 %
Hematocrit: 38.9 % (ref 34.0–46.6)
Hemoglobin: 12.8 g/dL (ref 11.1–15.9)
Immature Grans (Abs): 0 10*3/uL (ref 0.0–0.1)
Immature Granulocytes: 0 %
Lymphocytes Absolute: 1.9 10*3/uL (ref 0.7–3.1)
Lymphs: 40 %
MCH: 28.3 pg (ref 26.6–33.0)
MCHC: 32.9 g/dL (ref 31.5–35.7)
MCV: 86 fL (ref 79–97)
Monocytes Absolute: 0.5 10*3/uL (ref 0.1–0.9)
Monocytes: 11 %
Neutrophils Absolute: 2.3 10*3/uL (ref 1.4–7.0)
Neutrophils: 48 %
Platelets: 316 10*3/uL (ref 150–379)
RBC: 4.53 x10E6/uL (ref 3.77–5.28)
RDW: 13.5 % (ref 12.3–15.4)
WBC: 4.9 10*3/uL (ref 3.4–10.8)

## 2017-04-14 LAB — COMPREHENSIVE METABOLIC PANEL
ALT: 7 IU/L (ref 0–32)
AST: 10 IU/L (ref 0–40)
Albumin/Globulin Ratio: 1.6 (ref 1.2–2.2)
Albumin: 4.1 g/dL (ref 3.5–5.5)
Alkaline Phosphatase: 46 IU/L (ref 39–117)
BUN/Creatinine Ratio: 10 (ref 9–23)
BUN: 8 mg/dL (ref 6–20)
Bilirubin Total: 0.3 mg/dL (ref 0.0–1.2)
CO2: 24 mmol/L (ref 20–29)
Calcium: 9.2 mg/dL (ref 8.7–10.2)
Chloride: 103 mmol/L (ref 96–106)
Creatinine, Ser: 0.83 mg/dL (ref 0.57–1.00)
GFR calc Af Amer: 109 mL/min/{1.73_m2} (ref >59)
GFR calc non Af Amer: 94 mL/min/{1.73_m2} (ref >59)
Globulin, Total: 2.5 g/dL (ref 1.5–4.5)
Glucose: 79 mg/dL (ref 65–99)
Potassium: 5 mmol/L (ref 3.5–5.2)
Sodium: 140 mmol/L (ref 134–144)
Total Protein: 6.6 g/dL (ref 6.0–8.5)

## 2017-04-14 LAB — COMPREHENSIVE BRCA1/2 ANALYSIS

## 2017-04-14 LAB — BRCASSURE COMPREHENSIVE TEST

## 2017-04-14 LAB — TSH: TSH: 1.12 u[IU]/mL (ref 0.450–4.500)

## 2017-04-27 ENCOUNTER — Encounter: Payer: Self-pay | Admitting: Nurse Practitioner

## 2017-04-27 ENCOUNTER — Ambulatory Visit (INDEPENDENT_AMBULATORY_CARE_PROVIDER_SITE_OTHER): Payer: Managed Care, Other (non HMO) | Admitting: Nurse Practitioner

## 2017-04-27 VITALS — BP 114/66 | HR 86 | Temp 98.3°F | Ht 65.5 in | Wt 205.8 lb

## 2017-04-27 DIAGNOSIS — S80261A Insect bite (nonvenomous), right knee, initial encounter: Secondary | ICD-10-CM | POA: Diagnosis not present

## 2017-04-27 DIAGNOSIS — L089 Local infection of the skin and subcutaneous tissue, unspecified: Secondary | ICD-10-CM | POA: Diagnosis not present

## 2017-04-27 DIAGNOSIS — W57XXXA Bitten or stung by nonvenomous insect and other nonvenomous arthropods, initial encounter: Secondary | ICD-10-CM

## 2017-04-27 NOTE — Patient Instructions (Addendum)
Margaret Potter, Thank you for coming in to clinic today.  1. You have an insect bite that is healing well.  - If red, warm, hot, draining, swollen any more than it is now.  Call and let us know. - If itchy, use hydrocortisone cream 3-4 times a day if needed.  Please schedule a follow-up appointment with Wilhelmina McardleLauren Sahith Nurse, AGNP. Return 5-7 days if symptoms worsen or fail to improve.  If you have any other questions or concerns, please feel free to call the clinic or send a message through MyChart. You may also schedule an earlier appointment if necessary.  You will receive a survey after today's visit either digitally by e-mail or paper by [employer] mail. Your experiences and feedback matter to us.  Please respond so we know how we are doing as we provide care for you.   Wilhelmina McardleLauren Ester Hilley, DNP, AGNP-BC Adult Gerontology Nurse Practitioner [facility], [facility]

## 2017-04-27 NOTE — Progress Notes (Signed)
Subjective:    Patient ID: Margaret Potter, female    DOB: 08/20/1985, 31 y.o.   MRN: [id number]  Margaret Potter is a 31 y.o. female presenting on 04/27/2017 for Insect Bite (bite on the right leg x 1 week ago, itching, but no pain )   HPI  Insect Bite Was bitten by insect bite behind right knee about 1 week ago.  Initially had red bump and diffuse red macular rash surrounding the lesion.  Pt believes it was a mosquito bite.  Currently there is no rash, redness.  Only small lump. - Has been itchy and has been using hydrocortisone cream w/ relief. - Pt denies fever, chills, sweats, headache, nausea, vomiting, diarrhea and constipation.  Social History  Substance Use Topics  . Smoking status: Current Every Day Smoker    Packs/day: 0.25    Years: 5.00    Types: Cigarettes  . Smokeless tobacco: Current User     Comment: Quit prior 2 years, without treatment  . Alcohol use No    Review of Systems Per HPI unless specifically indicated above     Objective:    BP 114/66   Pulse 86   Temp 98.3 F (36.8 C) (Oral)   Ht 5' 5.5" (1.664 m)   Wt 205 lb 12.8 oz (93.4 kg)   BMI 33.73 kg/m   Wt Readings from Last 3 Encounters:  04/27/17 205 lb 12.8 oz (93.4 kg)  04/05/17 208 lb (94.3 kg)  03/24/17 207 lb 12.8 oz (94.3 kg)    Physical Exam  General - healthy, well-appearing, NAD HEENT - Normocephalic, atraumatic Neck - supple, non-tender, no LAD, no thyromegaly, no carotid bruit Heart - RRR, no murmurs heard Lungs - Clear throughout all lobes, no wheezing, crackles, or rhonchi. Normal work of breathing. Extremeties - non-tender, no edema, cap refill < 2 seconds, peripheral pulses intact +2 bilaterally Skin - warm, dry, lesion behind right knee: firm induration beneath skin and scab at site of presumed insect bite  Neuro - awake, alert, oriented x3, normal gait Psych - Normal mood and affect, normal behavior    Results for orders placed or performed in visit on 04/05/17    HPV DNA Probe (High Risk)  Result Value Ref Range   HPV, high-risk Negative Negative  CBC with Differential/Platelet  Result Value Ref Range   WBC 4.9 3.4 - 10.8 x10E3/uL   RBC 4.53 3.77 - 5.28 x10E6/uL   Hemoglobin 12.8 11.1 - 15.9 g/dL   Hematocrit 38.9 34.0 - 46.6 %   MCV 86 79 - 97 fL   MCH 28.3 26.6 - 33.0 pg   MCHC 32.9 31.5 - 35.7 g/dL   RDW 13.5 12.3 - 15.4 %   Platelets 316 150 - 379 x10E3/uL   Neutrophils 48 Not Estab. %   Lymphs 40 Not Estab. %   Monocytes 11 Not Estab. %   Eos 1 Not Estab. %   Basos 0 Not Estab. %   Neutrophils Absolute 2.3 1.4 - 7.0 x10E3/uL   Lymphocytes Absolute 1.9 0.7 - 3.1 x10E3/uL   Monocytes Absolute 0.5 0.1 - 0.9 x10E3/uL   EOS (ABSOLUTE) 0.1 0.0 - 0.4 x10E3/uL   Basophils Absolute 0.0 0.0 - 0.2 x10E3/uL   Immature Granulocytes 0 Not Estab. %   Immature Grans (Abs) 0.0 0.0 - 0.1 x10E3/uL  TSH  Result Value Ref Range   TSH 1.120 0.450 - 4.500 uIU/mL  Comprehensive metabolic panel  Result Value Ref Range   Glucose 79  65 - 99 mg/dL   BUN 8 6 - 20 mg/dL   Creatinine, Ser 0.83 0.57 - 1.00 mg/dL   GFR calc non Af Amer 94 >59 mL/min/1.73   GFR calc Af Amer 109 >59 mL/min/1.73   BUN/Creatinine Ratio 10 9 - 23   Sodium 140 134 - 144 mmol/L   Potassium 5.0 3.5 - 5.2 mmol/L   Chloride 103 96 - 106 mmol/L   CO2 24 20 - 29 mmol/L   Calcium 9.2 8.7 - 10.2 mg/dL   Total Protein 6.6 6.0 - 8.5 g/dL   Albumin 4.1 3.5 - 5.5 g/dL   Globulin, Total 2.5 1.5 - 4.5 g/dL   Albumin/Globulin Ratio 1.6 1.2 - 2.2   Bilirubin Total 0.3 0.0 - 1.2 mg/dL   Alkaline Phosphatase 46 39 - 117 IU/L   AST 10 0 - 40 IU/L   ALT 7 0 - 32 IU/L  BRCAssure Comprehensive Test  Result Value Ref Range   PREAUTHORIZATION Comment    EXTRACTION Comment   Comprehensive BRCA1/2 Analysis  Result Value Ref Range   Specimen Type Comment:    BRCA 1/2 Result Comment:    1349CTOR REVIEW Comment:   Specimen status report  Result Value Ref Range   specimen status report  Comment   POCT Wet Prep [facility] [place])  Result Value Ref Range   Source Wet Prep POC vagina    WBC, Wet Prep HPF POC few    Bacteria Wet Prep HPF POC Few Few   BACTERIA WET PREP MORPHOLOGY POC     Clue Cells Wet Prep HPF POC None None   Clue Cells Wet Prep Whiff POC     Yeast Wet Prep HPF POC Moderate (A)    KOH Wet Prep POC     Trichomonas Wet Prep HPF POC Absent Absent  Pap, ThinPrep rflx HPV with CT/GC  Result Value Ref Range   DIAGNOSIS: Comment    Specimen adequacy: Comment    Clinician Provided ICD10 Comment    Performed by: Comment    QC reviewed by: Comment    PAP Smear Comment .    Note: Comment    PAP Reflex Comment    Chlamydia, Nuc. Acid Amp Negative Negative   Gonococcus, Nuc. Acid Amp Negative Negative      Assessment & Plan:   Problem List Items Addressed This Visit    None    Visit Diagnoses    Infected insect bite of popliteal region, right, initial encounter    -  Primary   Healing insect bite without complication except for firm induration.  Likely self limited despite initial appearance w/ diffuse rash.  Plan: 1. Reviewed return criteria if develops abscess or cellulitis. 2. Continue using hydrocortisone cream topically for pruritis. 3. Follow up 5-7 days as needed.        Follow up plan: Return 5-7 days if symptoms worsen or fail to improve.   Cassell Smiles, DNP, AGPCNP-BC Adult Gerontology Primary Care Nurse Practitioner [facility] 04/27/2017, 9:04 AM

## 2017-05-03 LAB — SPECIMEN STATUS REPORT

## 2017-05-03 LAB — HPV DNA PROBE HIGH RISK, AMPLIFIED: HPV, high-risk: NEGATIVE

## 2017-06-06 ENCOUNTER — Encounter: Payer: 59 | Admitting: Nurse Practitioner

## 2018-03-15 LAB — LIPID PANEL
Cholesterol: 127 (ref 0–200)
HDL: 50 (ref 35–70)
LDL CALC: 67

## 2018-03-15 LAB — BASIC METABOLIC PANEL: Glucose: 95

## 2018-03-15 LAB — HEMOGLOBIN A1C: HEMOGLOBIN A1C: 5.2

## 2018-04-11 ENCOUNTER — Encounter: Payer: Self-pay | Admitting: Nurse Practitioner

## 2018-04-11 ENCOUNTER — Ambulatory Visit (INDEPENDENT_AMBULATORY_CARE_PROVIDER_SITE_OTHER): Payer: Managed Care, Other (non HMO) | Admitting: Nurse Practitioner

## 2018-04-11 ENCOUNTER — Other Ambulatory Visit: Payer: Self-pay

## 2018-04-11 VITALS — BP 115/70 | HR 78 | Temp 98.4°F | Ht 65.5 in | Wt 203.8 lb

## 2018-04-11 DIAGNOSIS — E6609 Other obesity due to excess calories: Secondary | ICD-10-CM

## 2018-04-11 DIAGNOSIS — Z6833 Body mass index (BMI) 33.0-33.9, adult: Secondary | ICD-10-CM | POA: Diagnosis not present

## 2018-04-11 DIAGNOSIS — Z716 Tobacco abuse counseling: Secondary | ICD-10-CM | POA: Diagnosis not present

## 2018-04-11 NOTE — Progress Notes (Signed)
Subjective:    Patient ID: Margaret Potter, female    DOB: [birth year], 32 y.o.   MRN: [id number]  Margaret Potter is a 32 y.o. female presenting on 04/11/2018 for Consult (wellness appeal smoking, weight management)   HPI Obesity Patient has current BMI 33.4 which is higher than goal for employer wellness screening.  Patient's personal goal is weight < 180 lbs.  Patient endorses she has self esteem issues when her weight is too low and in normal BMI range. 24 hour diet recall: Breakfast 6A - Oatmeal (instant strawberry and peach pack (2)); coffee black Snack 11:30A - Meat, cheese, nuts power pack with [place] and 2 waters, pecan pinwheel (small) Lunch 2:30P - Grilled Cheese and small doritos, 2 waters Dinner 6P - Pasta, chicken with alfredo 1.5 cups serving.  Smoking Cessation Patient reports she started smoking again after a family member died about 3 months ago.  She has continued smoking in response to increased stress.  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  Patient admits her smoking allows her an escape from stresses.   - Patient is ready to quit and has already started cutting back.  Patient has quit date set for 9/5: Tapering.  Is smoking at 3 cigarettes per day. - Patient states she continues to feel well prepared to reach her goal.  Needs additional advice for coping skills.      Social History   Tobacco Use  . Smoking status: Current Every Day Smoker    Packs/day: 0.25    Years: 5.00    Pack years: 1.25    Types: Cigarettes  . Smokeless tobacco: Current User  . Tobacco comment: Quit prior 2 years, without treatment. Pt states she's planning quit on 04/19/18  Substance Use Topics  . Alcohol use: No  . Drug use: No    Review of Systems Per HPI unless specifically indicated above     Objective:    BP 115/70 (BP Location: Left Arm, Patient Position: Sitting, Cuff Size: Large)   Pulse 78   Temp 98.4  F (36.9 C) (Oral)   Ht 5' 5.5" (1.664 m)   Wt 203 lb 12.8 oz (92.4 kg)   BMI 33.40 kg/m   Wt Readings from Last 3 Encounters:  04/11/18 203 lb 12.8 oz (92.4 kg)  04/27/17 205 lb 12.8 oz (93.4 kg)  04/05/17 208 lb (94.3 kg)    Physical Exam  Constitutional: She is oriented to person, place, and time. She appears well-developed and well-nourished. No distress.  HENT:  Head: Normocephalic and atraumatic.  Neck: Normal range of motion. Neck supple. No thyromegaly present.  Cardiovascular: Normal rate, regular rhythm, S1 normal, S2 normal, normal heart sounds and intact distal pulses.  Pulmonary/Chest: Effort normal and breath sounds normal. No respiratory distress.  Neurological: She is alert and oriented to person, place, and time.  Skin: Skin is warm and dry. Capillary refill takes less than 2 seconds.  Psychiatric: She has a normal mood and affect. Her behavior is normal. Judgment and thought content normal.  Vitals reviewed.   Results for orders placed or performed in visit on 04/04/18  Basic metabolic panel  Result Value Ref Range   Glucose 95   Lipid panel  Result Value Ref Range   Cholesterol 127 0 - 200   HDL 50 35 - 70   LDL Cholesterol 67   Hemoglobin A1c  Result Value Ref Range   Hemoglobin A1C 5.2  Assessment & Plan:   Problem List Items Addressed This Visit    None    Visit Diagnoses    Class 1 obesity due to excess calories without serious comorbidity with body mass index (BMI) of 33.0 to 33.9 in adult    -  Primary Improving very slowly over last 1 year, but with goal for faster improvement for employer/insurance healthy weight goals.  Patient with goal of 20 lbs weight loss over next 1 year. 24-hr diet recall very eye-opening to patient as she thought she was eating well prior to this exercise. - Reviewed need for higher protein content with breakfast - 15-20g to prevent extreme hunger prior to am snack. - Reviewed need for better quality carbohydrates  - Low glycemic diet handout provided. One change patient will make is to use old fashioned oats/rolled oats with better unsoluble fiber/less sugar than instant.   - Keep a food log to track patterns of food intake/opportunities to improve - Followup at annual physical in 6 months or sooner if needed.    Encounter for smoking cessation counseling     Patient ready to quit smoking with good start.  Needs additional resources for stress management strategies to prevent resumption of smoking. - Discussion today >5 minutes (<10 minutes) specifically on counseling on risks of tobacco use, complications, treatment, smoking cessation, stress management strategies. - START taking 5 minute break instead of a smoke break - Reviewed deep breathing. - Reviewed body scan - Followup as needed and in 6 months         Follow up plan: Return in about 6 months (around 10/12/2018) for annual physical.  Wilhelmina McardleLauren Johnatan Baskette, DNP, AGPCNP-BC Adult Gerontology Primary Care Nurse Practitioner [facility] 04/11/2018, 9:08 AM

## 2018-04-11 NOTE — Patient Instructions (Addendum)
Margaret Potter,   Thank you for coming in to clinic today.  1. Weight:  Goal 180 lbs: 1 year (-20 lbs) - Eat sufficient protein for breakfast - aim for 15-20 g in the morning. - Eat more old fashioned/rolled oats as overnight oats - Keep a food log so you are able to track patterns of food intake/opportunities to improve your overall eating habits. - You may find benefit from www.LimitL[employer].com.cymyfitnesspal.com or a simple notebook for tracking this.  2. Smoking: - Take your 5 minute break to escape without your cigarette - Take time for deep breathing.  4-7-8 breathing technique: breathe in to count of 4, hold breath for count of 7, exhale for count of 8; do 3-5 times for letting go of overactive thoughts - Also remember your body scan to find tension.  Please schedule a follow-up appointment with Wilhelmina McardleLauren Eloyse Causey, AGNP. Return in about 6 months (around 10/12/2018) for annual physical.  If you have any other questions or concerns, please feel free to call the clinic or send a message through MyChart. You may also schedule an earlier appointment if necessary.  You will receive a survey after today's visit either digitally by e-mail or paper by [employer] mail. Your experiences and feedback matter to us.  Please respond so we know how we are doing as we provide care for you.   Wilhelmina McardleLauren Danh Bayus, DNP, AGNP-BC Adult Gerontology Nurse Practitioner [facility], [facility]

## 2018-05-17 ENCOUNTER — Ambulatory Visit
Admission: RE | Admit: 2018-05-17 | Discharge: 2018-05-17 | Disposition: A | Discharge status: Home / Self Care | Payer: Managed Care, Other (non HMO) | Source: Ambulatory Visit | Attending: Nurse Practitioner | Admitting: Nurse Practitioner

## 2018-05-17 ENCOUNTER — Other Ambulatory Visit: Payer: Self-pay

## 2018-05-17 ENCOUNTER — Ambulatory Visit: Payer: Managed Care, Other (non HMO) | Admitting: Nurse Practitioner

## 2018-05-17 ENCOUNTER — Telehealth: Payer: Self-pay

## 2018-05-17 ENCOUNTER — Encounter: Payer: Self-pay | Admitting: Nurse Practitioner

## 2018-05-17 VITALS — BP 113/64 | HR 88 | Temp 98.6°F | Resp 16 | Ht 65.5 in | Wt 197.8 lb

## 2018-05-17 DIAGNOSIS — R0789 Other chest pain: Secondary | ICD-10-CM | POA: Insufficient documentation

## 2018-05-17 DIAGNOSIS — R5383 Other fatigue: Secondary | ICD-10-CM | POA: Insufficient documentation

## 2018-05-17 DIAGNOSIS — R05 Cough: Secondary | ICD-10-CM

## 2018-05-17 DIAGNOSIS — R058 Other specified cough: Secondary | ICD-10-CM

## 2018-05-17 MED ORDER — CEFTRIAXONE SODIUM 1 G IJ SOLR
1.0000 g | Freq: Once | INTRAMUSCULAR | Status: AC
  Administered 2018-05-17: 1 g via INTRAMUSCULAR

## 2018-05-17 MED ORDER — AZITHROMYCIN 250 MG PO TABS: ORAL_TABLET | ORAL | 0 refills | Status: DC

## 2018-05-17 MED ORDER — ALBUTEROL SULFATE HFA 108 (90 BASE) MCG/ACT IN AERS: 1.0000 | INHALATION_SPRAY | Freq: Four times a day (QID) | RESPIRATORY_TRACT | 2 refills | Status: DC | PRN

## 2018-05-17 MED ORDER — DOXYCYCLINE HYCLATE 100 MG PO TABS: 100.0000 mg | ORAL_TABLET | Freq: Two times a day (BID) | ORAL | 0 refills | Status: DC

## 2018-05-17 NOTE — Telephone Encounter (Signed)
Changed prescription to doxycyline 100 mg bid x 10 days.  New Rx sent.

## 2018-05-17 NOTE — Progress Notes (Signed)
Subjective:    Patient ID: Margaret Potter, female    DOB: 1986-06-04, 32 y.o.   MRN: [id number]  Margaret Potter is a 32 y.o. female presenting on 05/17/2018 for URI (coughing, chest congestion, chest discomfort w/ inhaling and productive cough  x 2 weeks)   HPI URI Cough, chest congestion, chest tightness with inhalation, productive cough, also hurts in back on R side. - No ear pain/pressure,  - No fever, chills, or sweats, nausea, vomiting, or diarrhea. - Youngest daughter was also sick, improved - Fatigue, malaise.  Poor sleep 2/2 increased nighttime coughing. - Patient also desires flu vaccine.     Social History   Tobacco Use  . Smoking status: Current Every Day Smoker    Packs/day: 0.10    Years: 5.00    Pack years: 0.50    Types: Cigarettes  . Smokeless tobacco: Current User  . Tobacco comment: Quit prior 2 years, without treatment. Pt states she's planning quit on 04/19/18  Substance Use Topics  . Alcohol use: No  . Drug use: No    Review of Systems Per HPI unless specifically indicated above     Objective:    BP 113/64 (BP Location: Right Arm, Patient Position: Sitting, Cuff Size: Normal)   Pulse 88   Temp 98.6 F (37 C) (Oral)   Resp 16   Ht 5' 5.5" (1.664 m)   Wt 197 lb 12.8 oz (89.7 kg)   SpO2 100%   BMI 32.42 kg/m   Wt Readings from Last 3 Encounters:  05/17/18 197 lb 12.8 oz (89.7 kg)  04/11/18 203 lb 12.8 oz (92.4 kg)  04/27/17 205 lb 12.8 oz (93.4 kg)    Physical Exam  Constitutional: She is oriented to person, place, and time. She appears well-developed and well-nourished. No distress.  HENT:  Head: Normocephalic and atraumatic.  Cardiovascular: Normal rate, regular rhythm, S1 normal, S2 normal, normal heart sounds and intact distal pulses.  Pulmonary/Chest: Effort normal. No respiratory distress. She has decreased breath sounds. She has no wheezes. She has no rhonchi. She has rales in the right middle field.  RMF positive egophony    Neurological: She is alert and oriented to person, place, and time.  Skin: Skin is warm and dry. Capillary refill takes less than 2 seconds.  Psychiatric: She has a normal mood and affect. Her speech is normal and behavior is normal. Judgment and thought content normal.  Vitals reviewed.  Results for orders placed or performed in visit on 04/04/18  Basic metabolic panel  Result Value Ref Range   Glucose 95   Lipid panel  Result Value Ref Range   Cholesterol 127 0 - 200   HDL 50 35 - 70   LDL Cholesterol 67   Hemoglobin A1c  Result Value Ref Range   Hemoglobin A1C 5.2     DG Chest 2 View CLINICAL DATA:  Cough and chest tightness  EXAM: CHEST - 2 VIEW  COMPARISON:  None.  FINDINGS: Lungs are clear. The heart size and pulmonary vascularity are normal. No adenopathy. No pneumothorax. No bone lesions.  IMPRESSION: No edema or consolidation.  Electronically Signed   By: Bretta Bang III M.D.   On: 05/17/2018 10:39      Assessment & Plan:   Problem List Items Addressed This Visit    None    Visit Diagnoses    Fatigue, unspecified type    -  Primary   Relevant Orders   DG Chest 2 View  Productive cough       Relevant Medications   albuterol (PROVENTIL HFA;VENTOLIN HFA) 108 (90 Base) MCG/ACT inhaler   cefTRIAXone (ROCEPHIN) injection 1 g   azithromycin (ZITHROMAX) 250 MG tablet   Other Relevant Orders   DG Chest 2 View   Chest tightness       Relevant Medications   albuterol (PROVENTIL HFA;VENTOLIN HFA) 108 (90 Base) MCG/ACT inhaler   Other Relevant Orders   DG Chest 2 View    Presumed CAP in otherwise healthy. May have evolved from prolonged viral URI / bronchitis. Not recent hospitalization or IV antibiotics within past 90 days. No history of asthma/COPD. No acute wheezing and minimal response to albuterol.  Patient is a smoker (reduced to 2 cigs a day).  No confirmation on Xray of pneumonia.  Will continue treatment based on clinical suspicion from  patient presentation. - Personal review of Chest Xray images reveal no consolidations consistent with pneumonia.  Radiology report also consistent.   Plan: 1. Start empiric antibiotics with Ceftriaxone IM 1g x 1 dose in office, Azithromycin PO 500mg  (day 1) then 250mg  daily x 4 days 2. Recommend improve hydration and add Mucinex-DM twice daily for up to 5-7 days if sputum still thick 3. Tessalon Perls for cough TID PRN - patient declined 4. Tylenol, Ibuprofen PRN fevers 5. RTC within 1 week if not improving, otherwise strict return criteria to go to ED   Meds ordered this encounter  Medications  . albuterol (PROVENTIL HFA;VENTOLIN HFA) 108 (90 Base) MCG/ACT inhaler    Sig: Inhale 1-2 puffs into the lungs every 6 (six) hours as needed for shortness of breath.    Dispense:  1 Inhaler    Refill:  2    Product selection permitted for insurance/patient brand preference    Order Specific Question:   Supervising Provider    Answer:   Smitty Cords [[id number]]  . cefTRIAXone (ROCEPHIN) injection 1 g  . azithromycin (ZITHROMAX) 250 MG tablet    Sig: Take one tablet twice today.  Then, take 1 tablet daily for 4 days.    Dispense:  6 tablet    Refill:  0    Order Specific Question:   Supervising Provider    Answer:   Smitty Cords [[id number]]    Follow up plan: Return 5-7 days if symptoms worsen or fail to improve.  Margaret Mcardle, DNP, AGPCNP-BC Adult Gerontology Primary Care Nurse Practitioner [facility] 05/17/2018, 9:45 AM

## 2018-05-17 NOTE — Telephone Encounter (Signed)
The pt is allergic to azithromycin. Pharmacy called to clarify the prescription.

## 2018-05-17 NOTE — Patient Instructions (Addendum)
Margaret Potter,   Thank you for coming in to clinic today.  1. You have pneumonia, most likely.  You have gotten 1g IM rocephin today. START azithromycin 250 mg tablet. Take 2 tablets today.  Then, take 1 tablet daily for the next 4 days and stop. Continue mucinex or mucinex DM for loosening secretions and controlling cough. Drink plenty of fluids and get plenty of rest over the next 24 hours.  Please schedule a follow-up appointment with [name], AGNP. Return 5-7 days if symptoms worsen or fail to improve.  If you have any other questions or concerns, please feel free to call the clinic or send a message through MyChart. You may also schedule an earlier appointment if necessary.  You will receive a survey after today's visit either digitally by e-mail or paper by [employer]. Your experiences and feedback matter to [place].  Please respond so we know how we are doing as we provide care for you.   [name], DNP, AGNP-BC Adult Gerontology Nurse Practitioner [facility], [facility]   Community-Acquired Pneumonia, Adult Pneumonia is an infection of the lungs. One type of pneumonia can happen while a person is in a hospital. A different type can happen when a person is not in a hospital (community-acquired pneumonia). It is easy for this kind to spread from person to person. It can spread to you if you breathe near an infected person who coughs or sneezes. Some symptoms include:  A dry cough.  A wet (productive) cough.  Fever.  Sweating.  Chest pain.  Follow these instructions at home:  Take over-the-counter and prescription medicines only as told by your doctor. ? Only take cough medicine if you are losing sleep. ? If you were prescribed an antibiotic medicine, take it as told by your doctor. Do not stop taking the antibiotic even if you start to feel better.  Sleep with your head and neck raised (elevated). You can do this by putting a few pillows under  your head, or you can sleep in a recliner.  Do not use tobacco products. These include cigarettes, chewing tobacco, and e-cigarettes. If you need help quitting, ask your doctor.  Drink enough water to keep your pee (urine) clear or pale yellow. A shot (vaccine) can help prevent pneumonia. Shots are often suggested for:  People older than 32 years of age.  People older than 32 years of age: ? Who are having cancer treatment. ? Who have long-term (chronic) lung disease. ? Who have problems with their body's defense system (immune system).  You may also prevent pneumonia if you take these actions:  Get the flu (influenza) shot every year.  Go to the dentist as often as told.  Wash your hands often. If soap and water are not available, use hand sanitizer.  Contact a doctor if:  You have a fever.  You lose sleep because your cough medicine does not help. Get help right away if:  You are short of breath and it gets worse.  You have more chest pain.  Your sickness gets worse. This is very serious if: ? You are an older adult. ? Your body's defense system is weak.  You cough up blood. This information is not intended to replace advice given to you by your health care provider. Make sure you discuss any questions you have with your health care provider. Document Released: 01/18/2008 Document Revised: 01/07/2016 Document Reviewed: 11/26/2014 Elsevier Interactive Patient Education  [employer].

## 2018-05-23 ENCOUNTER — Encounter: Payer: Self-pay | Admitting: Nurse Practitioner

## 2018-09-02 ENCOUNTER — Ambulatory Visit
Admission: EM | Admit: 2018-09-02 | Discharge: 2018-09-02 | Disposition: A | Discharge status: Home / Self Care | Payer: Managed Care, Other (non HMO) | Attending: Family Medicine | Admitting: Family Medicine

## 2018-09-02 DIAGNOSIS — R51 Headache: Secondary | ICD-10-CM | POA: Diagnosis not present

## 2018-09-02 DIAGNOSIS — R519 Headache, unspecified: Secondary | ICD-10-CM

## 2018-09-02 DIAGNOSIS — Z20828 Contact with and (suspected) exposure to other viral communicable diseases: Secondary | ICD-10-CM | POA: Insufficient documentation

## 2018-09-02 LAB — RAPID INFLUENZA A&B ANTIGENS (ARMC ONLY): INFLUENZA A (ARMC): NEGATIVE

## 2018-09-02 LAB — RAPID INFLUENZA A&B ANTIGENS: Influenza B (ARMC): NEGATIVE

## 2018-09-02 MED ORDER — KETOROLAC TROMETHAMINE 60 MG/2ML IM SOLN
60.0000 mg | Freq: Once | INTRAMUSCULAR | Status: AC
  Administered 2018-09-02: 60 mg via INTRAMUSCULAR

## 2018-09-02 MED ORDER — OSELTAMIVIR PHOSPHATE 75 MG PO CAPS: 75.0000 mg | ORAL_CAPSULE | Freq: Every day | ORAL | 0 refills | Status: AC

## 2018-09-02 NOTE — Discharge Instructions (Signed)
Take medication as prescribed. Rest. Drink plenty of fluids.  ° °Follow up with your primary care physician this week as needed. Return to Urgent care for new or worsening concerns.  ° °

## 2018-09-02 NOTE — ED Triage Notes (Signed)
Pt here for headache for 2 days, on and off. Did bring her daughter in 2 days ago and she was diagnosed with the flu so she just wanted to make sure she is okay. Did want a flu test.

## 2018-09-02 NOTE — ED Provider Notes (Signed)
[facility] ____________________________________________  Time seen: Approximately 4:07 PM  I have reviewed the triage vital signs and the nursing notes.   HISTORY  Chief Complaint Headache   HPI Margaret Potter is a 33 y.o. female presenting for evaluation of headache complaint.  Patient states that she was bringing her daughter in for flulike symptoms and wanted herself to be evaluated as well as concern for flu.  Patient reports 2 of her daughters positive for influenza.  Patient reports forehead and right-sided forehead headache intermittently since yesterday.  States this feels consistent with her normal headaches without abnormalities.  States did take ibuprofen this morning which helped.  States headache is currently mild.  Gradual in onset.  No vomiting.  Denies vision changes.  Denies pain radiation, paresthesias, confusion, unsteady gait, trauma, chest pain, shortness of breath or abdominal pain.  Denies cough, congestion, sore throat or fevers.  Did report one episode of hot cold sensation yesterday while at work, none otherwise.  Patient again reports this feels consistent with her normal headaches.  Continues to eat and drink well.  Denies other abnormalities.  Reports otherwise doing well.  Galen Manila, NP: PCP No LMP recorded. (Menstrual status: IUD).  Denies pregnancy   History reviewed. No pertinent past medical history.  Patient Active Problem List   Diagnosis Date Noted  . Obesity (BMI 30.0-34.9) 06/01/2016  . Environmental and seasonal allergies 06/01/2016  . Contraception, device intrauterine 06/01/2016  . Tobacco use 06/01/2016  . History of anemia 07/16/2014    Past Surgical History:  Procedure Laterality Date  . arm surgery Right    Question if hardware retained, unsure childhood surgery  . 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  ketorolac (TORADOL) injection 60 mg, 60 mg, Intramuscular, Once, Renford Dills, NP  Current Outpatient Medications:  .  levonorgestrel (MIRENA) 20 MCG/24HR IUD, 1 each by Intrauterine route once., Disp: , Rfl:  .  albuterol (PROVENTIL HFA;VENTOLIN HFA) 108 (90 Base) MCG/ACT inhaler, Inhale 1-2 puffs into the lungs every 6 (six) hours as needed for shortness of breath., Disp: 1 Inhaler, Rfl: 2 .  amoxicillin-clavulanate (AUGMENTIN) 875-125 MG tablet, TK 1 T PO BID FOR 10 DAYS, Disp: , Rfl: 0 .  doxycycline (VIBRA-TABS) 100 MG tablet, Take 1 tablet (100 mg total) by mouth 2 (two) times daily., Disp: 20 tablet, Rfl: 0 .  fluticasone (FLONASE) 50 MCG/ACT nasal spray, Place 2 sprays into both nostrils daily. (Patient not taking: Reported on 05/17/2018), Disp: 16 g, Rfl: 6 .  ibuprofen (ADVIL,MOTRIN) 200 MG tablet, Take 200-400 mg by mouth every 6 (six) hours as needed for headache or cramping., Disp: , Rfl:  .  loratadine (CLARITIN) 10 MG tablet, Take 10 mg by mouth daily as needed for allergies., Disp: , Rfl:  .  oseltamivir (TAMIFLU) 75 MG capsule, Take 1 capsule (75 mg total) by mouth daily for 10 days., Disp: 10 capsule, Rfl: 0  Allergies Azithromycin  Family History  Problem Relation Age of Onset  . Arthritis Mother   . Cancer Mother        cervical  . Depression Mother   . Diabetes Mother   . Heart disease Mother   . Hyperlipidemia Mother   . Hypertension Mother   . Stroke Mother   . Depression Father   . Ulcers Father   . Depression Brother   .  Arthritis Maternal Grandmother   . Asthma Maternal Grandmother   . Cancer Maternal Grandmother        breast  . Depression Maternal Grandmother   . Diabetes Maternal Grandmother   . Heart disease Maternal Grandmother   . Hypertension Maternal Grandmother   . Stroke Maternal Grandmother   . Vision loss Maternal Grandmother   . Arthritis Maternal Grandfather   . Alcohol abuse Maternal Grandfather    . Cancer Maternal Grandfather   . Hypertension Maternal Grandfather   . Stroke Maternal Grandfather   . Heart attack Maternal Grandfather   . Arthritis Paternal Grandmother   . Diabetes Paternal Grandmother   . Hypertension Paternal Grandmother   . Vision loss Paternal Grandmother   . Ovarian cancer Neg Hx   . Colon cancer Neg Hx     Social History Social History   Tobacco Use  . Smoking status: Current Every Day Smoker    Packs/day: 0.10    Years: 5.00    Pack years: 0.50    Types: Cigarettes  . Smokeless tobacco: Current User  . Tobacco comment: Quit prior 2 years, without treatment. Pt states she's planning quit on 04/19/18  Substance Use Topics  . Alcohol use: No  . Drug use: No    Review of Systems Constitutional: No fever Eyes: No visual changes. ENT: No sore throat. Cardiovascular: Denies chest pain. Respiratory: Denies shortness of breath. Gastrointestinal: No abdominal pain.  No nausea, no vomiting.  No diarrhea.  Musculoskeletal: Negative for back pain. Skin: Negative for rash. Neurological: Negative for focal weakness or numbness.   ____________________________________________   PHYSICAL EXAM:  VITAL SIGNS: ED Triage Vitals  Enc Vitals Group     BP 09/02/18 1526 131/75     Pulse Rate 09/02/18 1526 83     Resp 09/02/18 1526 18     Temp 09/02/18 1526 98.1 F (36.7 C)     Temp Source 09/02/18 1526 Oral     SpO2 09/02/18 1526 100 %     Weight 09/02/18 1527 194 lb (88 kg)     Height --      Head Circumference --      Peak Flow --      Pain Score 09/02/18 1527 4     Pain Loc --      Pain Edu? --      Excl. in GC? --     Constitutional: Alert and oriented. Well appearing and in no acute distress. Eyes: Conjunctivae are normal. PERRL. EOMI. Head: Atraumatic. No sinus tenderness to palpation. No swelling. No erythema.  Ears: no erythema, normal TMs bilaterally.   Nose:No nasal congestion  Mouth/Throat: Mucous membranes are moist. No pharyngeal  erythema. No tonsillar swelling or exudate.  Neck: No stridor.  No cervical spine tenderness to palpation. Hematological/Lymphatic/Immunilogical: No cervical lymphadenopathy. Cardiovascular: Normal rate, regular rhythm. Grossly normal heart sounds.  Good peripheral circulation. Respiratory: Normal respiratory effort.  No retractions. No wheezes, rales or rhonchi. Good air movement.  Musculoskeletal: Ambulatory with steady gait. No cervical, thoracic or lumbar tenderness to palpation. Neurologic:  Normal speech and language. No gait instability.  No focal neurological deficits.  Negative Romberg.  No paresthesias.  5 out of 5 strength bilaterally to upper lower extremities.  Bilateral hand grip strong and equal.  No ataxia. Skin:  Skin appears warm, dry and intact. No rash noted. Psychiatric: Mood and affect are normal. Speech and behavior are normal. ___________________________________________   LABS (all labs ordered are listed, but only abnormal  results are displayed)  Labs Reviewed  RAPID INFLUENZA A&B ANTIGENS ([facility] ONLY)     PROCEDURES Procedures   INITIAL IMPRESSION / ASSESSMENT AND PLAN / ED COURSE  Pertinent labs & imaging results that were available during my care of the patient were reviewed by me and considered in my medical decision making (see chart for details).  Well-appearing patient.  No acute distress.  No focal neurological deficit.  Flu swab negative.  Doubt influenza.  Suspect patient with current tension headache.  60 mg IM Toradol given once.  Discussed evaluation of influenza with patient, as patient is currently exposed to influenza at home, will Rx prophylactic Tamiflu dose.  Encourage rest, fluids, supportive care.  Discussed follow-up and return parameters. Discussed indication, risks and benefits of medications with patient.  Work note given for tomorrow.  Discussed follow up with Primary care physician this week. Discussed follow up and return parameters  including no resolution or any worsening concerns. Patient verbalized understanding and agreed to plan.   ____________________________________________   FINAL CLINICAL IMPRESSION(S) / ED DIAGNOSES  Final diagnoses:  Acute nonintractable headache, unspecified headache type  Exposure to influenza     ED Discharge Orders         Ordered    oseltamivir (TAMIFLU) 75 MG capsule  Daily     09/02/18 1606           Note: This dictation was prepared with Dragon dictation along with smaller phrase technology. Any transcriptional errors that result from this process are unintentional.         Renford Dills, NP 09/02/18 1709

## 2018-09-11 ENCOUNTER — Encounter: Payer: Self-pay | Admitting: Emergency Medicine

## 2018-09-11 ENCOUNTER — Ambulatory Visit
Admission: EM | Admit: 2018-09-11 | Discharge: 2018-09-11 | Disposition: A | Discharge status: Home / Self Care | Payer: Managed Care, Other (non HMO) | Attending: Family Medicine | Admitting: Family Medicine

## 2018-09-11 ENCOUNTER — Other Ambulatory Visit: Payer: Self-pay

## 2018-09-11 DIAGNOSIS — J040 Acute laryngitis: Secondary | ICD-10-CM | POA: Insufficient documentation

## 2018-09-11 DIAGNOSIS — F17210 Nicotine dependence, cigarettes, uncomplicated: Secondary | ICD-10-CM

## 2018-09-11 MED ORDER — BENZONATATE 200 MG PO CAPS: ORAL_CAPSULE | ORAL | 0 refills | Status: DC

## 2018-09-11 MED ORDER — ALBUTEROL SULFATE HFA 108 (90 BASE) MCG/ACT IN AERS: 1.0000 | INHALATION_SPRAY | Freq: Four times a day (QID) | RESPIRATORY_TRACT | 0 refills | Status: DC | PRN

## 2018-09-11 NOTE — ED Triage Notes (Signed)
Patient in today c/o hoarseness x 2 days and slight cough. Patient denies fever. Patient has tried OTC Tylenol cold/flu, vitamin c and is still taking Tamiflu.

## 2018-09-11 NOTE — Discharge Instructions (Addendum)
Plenty of fluids.  Rest your voice is much as possible.  Use Tessalon Perles for cough.  Albuterol inhaler as necessary for shortness of breath

## 2018-09-11 NOTE — ED Provider Notes (Signed)
[facility]    CSN: [id number] Arrival date & time: 09/11/18  1824     History   Chief Complaint Chief Complaint  Patient presents with  . Hoarse    HPI Ireland S Boley is a 33 y.o. female.   HPI  33 year old female presents today with hoarseness that she has had for 2 days along with a slight cough.  She denies any fevers or chills.  She is tried over-the-counter Tylenol Cold and flu vitamin C and is still taking Tamiflu from a prescription when she was here on 09/02/2018.  Not complain of sore throat.  That she does feel better after starting the Tamiflu but has recently started with the laryngitis.       History reviewed. No pertinent past medical history.  Patient Active Problem List   Diagnosis Date Noted  . Obesity (BMI 30.0-34.9) 06/01/2016  . Environmental and seasonal allergies 06/01/2016  . Contraception, device intrauterine 06/01/2016  . Tobacco use 06/01/2016  . History of anemia 07/16/2014    Past Surgical History:  Procedure Laterality Date  . arm surgery Right    Question if hardware retained, unsure childhood surgery  . 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      Home Medications    Prior to Admission medications   Medication Sig Start Date End Date Taking? Authorizing Provider  fluticasone (FLONASE) 50 MCG/ACT nasal spray Place 2 sprays into both nostrils daily. 01/19/17  Yes Galen Manila, NP  ibuprofen (ADVIL,MOTRIN) 200 MG tablet Take 200-400 mg by mouth every 6 (six) hours as needed for headache or cramping.   Yes [provider]  levonorgestrel (MIRENA) 20 MCG/24HR IUD 1 each by Intrauterine route once.   Yes [provider]  loratadine (CLARITIN) 10 MG tablet Take 10 mg by mouth daily as needed for allergies.   Yes [provider]  oseltamivir (TAMIFLU) 75 MG capsule  Take 1 capsule (75 mg total) by mouth daily for 10 days. 09/02/18 09/12/18 Yes Renford Dills, NP  albuterol (PROVENTIL HFA;VENTOLIN HFA) 108 (90 Base) MCG/ACT inhaler Inhale 1-2 puffs into the lungs every 6 (six) hours as needed for wheezing or shortness of breath. Use with spacer 09/11/18   Lutricia Feil, PA-C  benzonatate (TESSALON) 200 MG capsule Take one cap TID PRN cough 09/11/18   Lutricia Feil, PA-C    Family History Family History  Problem Relation Age of Onset  . Arthritis Mother   . Cancer Mother        cervical  . Depression Mother   . Diabetes Mother   . Heart disease Mother   . Hyperlipidemia Mother   . Hypertension Mother   . Stroke Mother   . Depression Father   . Ulcers Father   . Depression Brother   . Arthritis Maternal Grandmother   . Asthma Maternal Grandmother   . Cancer Maternal Grandmother        breast  . Depression Maternal Grandmother   . Diabetes Maternal Grandmother   . Heart disease Maternal Grandmother   . Hypertension Maternal Grandmother   . Stroke Maternal Grandmother   . Vision loss Maternal Grandmother   . Arthritis Maternal Grandfather   . Alcohol abuse Maternal Grandfather   . Cancer Maternal Grandfather   . Hypertension Maternal Grandfather   . Stroke Maternal Grandfather   . Heart  attack Maternal Grandfather   . Arthritis Paternal Grandmother   . Diabetes Paternal Grandmother   . Hypertension Paternal Grandmother   . Vision loss Paternal Grandmother   . Ovarian cancer Neg Hx   . Colon cancer Neg Hx     Social History Social History   Tobacco Use  . Smoking status: Current Every Day Smoker    Packs/day: 0.10    Years: 5.00    Pack years: 0.50    Types: Cigarettes  . Smokeless tobacco: Never Used  . Tobacco comment: Quit prior 2 years, without treatment. Pt states she's planning quit on 04/19/18  Substance Use Topics  . Alcohol use: No  . Drug use: No     Allergies   Azithromycin   Review of Systems Review of  Systems  Constitutional: Positive for activity change. Negative for appetite change, chills, diaphoresis, fatigue and fever.  HENT: Positive for voice change.   Respiratory: Positive for cough and shortness of breath.   All other systems reviewed and are negative.    Physical Exam Triage Vital Signs ED Triage Vitals  Enc Vitals Group     BP 09/11/18 1853 118/78     Pulse Rate 09/11/18 1853 91     Resp 09/11/18 1853 16     Temp 09/11/18 1853 98.2 F (36.8 C)     Temp Source 09/11/18 1853 Oral     SpO2 09/11/18 1853 100 %     Weight 09/11/18 1852 194 lb (88 kg)     Height 09/11/18 1852 5' 6.5" (1.689 m)     Head Circumference --      Peak Flow --      Pain Score 09/11/18 1852 0     Pain Loc --      Pain Edu? --      Excl. in GC? --    No data found.  Updated Vital Signs BP 118/78 (BP Location: Left Arm)   Pulse 91   Temp 98.2 F (36.8 C) (Oral)   Resp 16   Ht 5' 6.5" (1.689 m)   Wt 194 lb (88 kg)   SpO2 100%   BMI 30.84 kg/m   Visual Acuity Right Eye Distance:   Left Eye Distance:   Bilateral Distance:    Right Eye Near:   Left Eye Near:    Bilateral Near:     Physical Exam Vitals signs and nursing note reviewed.  Constitutional:      General: She is not in acute distress.    Appearance: Normal appearance. She is normal weight. She is not ill-appearing, toxic-appearing or diaphoretic.  HENT:     Head: Normocephalic.     Right Ear: Tympanic membrane, ear canal and external ear normal.     Left Ear: Tympanic membrane, ear canal and external ear normal.     Nose: Nose normal. No congestion or rhinorrhea.     Mouth/Throat:     Mouth: Mucous membranes are moist.     Pharynx: Oropharynx is clear. No oropharyngeal exudate or posterior oropharyngeal erythema.     Comments: Patient has hoarseness Eyes:     General:        Right eye: No discharge.        Left eye: No discharge.     Conjunctiva/sclera: Conjunctivae normal.  Neck:     Musculoskeletal: Normal  range of motion and neck supple.  Pulmonary:     Effort: Pulmonary effort is normal.     Breath sounds: Normal breath  sounds.  Musculoskeletal: Normal range of motion.  Lymphadenopathy:     Cervical: No cervical adenopathy.  Skin:    General: Skin is warm and dry.  Neurological:     General: No focal deficit present.     Mental Status: She is alert and oriented to person, place, and time.  Psychiatric:        Mood and Affect: Mood normal.        Behavior: Behavior normal.        Thought Content: Thought content normal.        Judgment: Judgment normal.      UC Treatments / Results  Labs (all labs ordered are listed, but only abnormal results are displayed) Labs Reviewed - No data to display  EKG None  Radiology No results found.  Procedures Procedures (including critical care time)  Medications Ordered in UC Medications - No data to display  Initial Impression / Assessment and Plan / UC Course  I have reviewed the triage vital signs and the nursing notes.  Pertinent labs & imaging results that were available during my care of the patient were reviewed by me and considered in my medical decision making (see chart for details).   The patient has  a viral illness and the laryngitis will resolve on its own.  She needs to  rest her voice as much as possible.  Shortness of breath I will provide her with albuterol inhaler.  Provide her with Jerilynn Somessalon Perles for her cough.  Since that she may return to our clinic or see her primary care physician.   Final Clinical Impressions(s) / UC Diagnoses   Final diagnoses:  Laryngitis     Discharge Instructions     Plenty of fluids.  Rest your voice is much as possible.  Use Tessalon Perles for cough.  Albuterol inhaler as necessary for shortness of breath   ED Prescriptions    Medication Sig Dispense Auth. Provider   albuterol (PROVENTIL HFA;VENTOLIN HFA) 108 (90 Base) MCG/ACT inhaler Inhale 1-2 puffs into the lungs every 6  (six) hours as needed for wheezing or shortness of breath. Use with spacer 1 Inhaler Lutricia Feiloemer, Javeria Briski P, PA-C   benzonatate (TESSALON) 200 MG capsule Take one cap TID PRN cough 30 capsule Lutricia Feiloemer, Azarius Lambson P, PA-C     Controlled Substance Prescriptions [place] Controlled Substance Registry consulted? Not Applicable   Lutricia FeilRoemer, Maryuri Warnke P, PA-C 09/11/18 2118

## 2018-09-21 ENCOUNTER — Telehealth: Payer: Self-pay | Admitting: Nurse Practitioner

## 2018-09-21 NOTE — Telephone Encounter (Signed)
Pt has about taking her IUD out early and then putting it back in [phone]

## 2018-09-21 NOTE — Telephone Encounter (Signed)
The pt was notified that she needs to contact her OB GYN to answer questions about insertion and removal of her IUD. Since they are the ones who manage her birth control (IUD). She verbalize understanding, no questions or concerns.

## 2018-10-16 ENCOUNTER — Encounter: Payer: Self-pay | Admitting: Obstetrics and Gynecology

## 2018-10-16 ENCOUNTER — Ambulatory Visit (INDEPENDENT_AMBULATORY_CARE_PROVIDER_SITE_OTHER): Payer: Managed Care, Other (non HMO) | Admitting: Obstetrics and Gynecology

## 2018-10-16 VITALS — BP 120/80 | Ht 66.0 in | Wt 192.0 lb

## 2018-10-16 DIAGNOSIS — Z1339 Encounter for screening examination for other mental health and behavioral disorders: Secondary | ICD-10-CM

## 2018-10-16 DIAGNOSIS — Z113 Encounter for screening for infections with a predominantly sexual mode of transmission: Secondary | ICD-10-CM

## 2018-10-16 DIAGNOSIS — Z1331 Encounter for screening for depression: Secondary | ICD-10-CM

## 2018-10-16 DIAGNOSIS — Z01419 Encounter for gynecological examination (general) (routine) without abnormal findings: Secondary | ICD-10-CM | POA: Diagnosis not present

## 2018-10-16 DIAGNOSIS — Z124 Encounter for screening for malignant neoplasm of cervix: Secondary | ICD-10-CM

## 2018-10-16 NOTE — Progress Notes (Signed)
Gynecology Annual Exam  PCP: Margaret Manila, NP  Chief Complaint:  Chief Complaint  Patient presents with  . Gynecologic Exam   History of Present Illness:  [name] is a 33 y.o. [phone] who LMP was No LMP recorded. (Menstrual status: IUD)., presents today for her annual examination.  Her menses are irregular and infrequent.  She is not sexually active.  Last Pap: 03/2017  Results were: no abnormalities /neg HPV DNA not done Hx of STDs: none  There is no FH of breast cancer. There is no FH of ovarian cancer. The patient does not do self-breast exams.  Tobacco use: former smoker. Alcohol use: none Exercise: moderately active (walking 20 minutes every day)  The patient wears seatbelts: yes.   The patient reports that domestic violence in her life is absent.   Past Medical History:  Diagnosis Date  . No known health problems     Past Surgical History:  Procedure Laterality Date  . arm surgery Right    Question if hardware retained, unsure childhood surgery  . EXCISION VAGINAL CYST N/A 2015   Midline, chronic problem (inclusion cyst s/p prior suture repair from pregnancy)    Prior to Admission medications   Medication Sig Start Date End Date Taking? Authorizing Provider  ibuprofen (ADVIL,MOTRIN) 200 MG tablet Take 200-400 mg by mouth every 6 (six) hours as needed for headache or cramping.   Yes [provider]  levonorgestrel (MIRENA) 20 MCG/24HR IUD 1 each by Intrauterine route once.   Yes [provider]  loratadine (CLARITIN) 10 MG tablet Take 10 mg by mouth daily as needed for allergies.   Yes [provider]    Allergies  Allergen Reactions  . Azithromycin Hives   Obstetric History: [id number]  Social History   Socioeconomic History  . Marital status: Single    Spouse name: Not on file  . Number of children: 3  . Years of education: College  . Highest education level: Not on file  Occupational History  .  Occupation: Paramedic  Social Needs  . Financial resource strain: Not on file  . Food insecurity:    Worry: Not on file    Inability: Not on file  . Transportation needs:    Medical: Not on file    Non-medical: Not on file  Tobacco Use  . Smoking status: Former Smoker    Packs/day: 0.10    Years: 5.00    Pack years: 0.50    Types: Cigarettes    Last attempt to quit: 09/17/2018    Years since quitting: 0.0  . Smokeless tobacco: Never Used  . Tobacco comment: Quit prior 2 years, without treatment. Pt states she's planning quit on 04/19/18  Substance and Sexual Activity  . Alcohol use: No  . Drug use: No  . Sexual activity: Not Currently    Birth control/protection: I.U.D.  Lifestyle  . Physical activity:    Days per week: Not on file    Minutes per session: Not on file  . Stress: Not on file  Relationships  . Social connections:    Talks on phone: Not on file    Gets together: Not on file    Attends religious service: Not on file    Active member of club or organization: Not on file    Attends meetings of clubs or organizations: Not on file    Relationship status: Not on file  . Intimate partner violence:    Fear of current or  ex partner: Not on file    Emotionally abused: Not on file    Physically abused: Not on file    Forced sexual activity: Not on file  Other Topics Concern  . Not on file  Social History Narrative  . Not on file    Family History  Problem Relation Age of Onset  . Arthritis Mother   . Cancer Mother        cervical  . Depression Mother   . Diabetes Mother   . Heart disease Mother   . Hyperlipidemia Mother   . Hypertension Mother   . Stroke Mother   . Depression Father   . Ulcers Father   . Depression Brother   . Arthritis Maternal Grandmother   . Asthma Maternal Grandmother   . Cancer Maternal Grandmother        breast  . Depression Maternal Grandmother   . Diabetes Maternal Grandmother   . Heart disease Maternal Grandmother   .  Hypertension Maternal Grandmother   . Stroke Maternal Grandmother   . Vision loss Maternal Grandmother   . Arthritis Maternal Grandfather   . Alcohol abuse Maternal Grandfather   . Cancer Maternal Grandfather   . Hypertension Maternal Grandfather   . Stroke Maternal Grandfather   . Heart attack Maternal Grandfather   . Arthritis Paternal Grandmother   . Diabetes Paternal Grandmother   . Hypertension Paternal Grandmother   . Vision loss Paternal Grandmother   . Ovarian cancer Neg Hx   . Colon cancer Neg Hx     Review of Systems  Constitutional: Negative.   HENT: Negative.   Eyes: Negative.   Respiratory: Negative.   Cardiovascular: Negative.   Gastrointestinal: Negative.   Genitourinary: Negative.   Musculoskeletal: Negative.   Skin: Negative.   Neurological: Negative.   Psychiatric/Behavioral: Negative.      Physical Exam BP 120/80   Ht 5\' 6"  (1.676 m)   Wt 192 lb (87.1 kg)   BMI 30.99 kg/m    Physical Exam Constitutional:      General: She is not in acute distress.    Appearance: Normal appearance. She is well-developed.  Genitourinary:     Pelvic exam was performed with patient supine.     Vulva, urethra, bladder and uterus normal.     No inguinal adenopathy present in the right or left side.    No signs of injury in the vagina.     No vaginal discharge, erythema, tenderness or bleeding.     No cervical motion tenderness, discharge, lesion or polyp.     Uterus is mobile.     Uterus is not enlarged or tender.     No uterine mass detected.    Uterus is anteverted.     No right or left adnexal mass present.     Right adnexa not tender or full.     Left adnexa not tender or full.  HENT:     Head: Normocephalic and atraumatic.  Eyes:     General: No scleral icterus.    Conjunctiva/sclera: Conjunctivae normal.  Neck:     Musculoskeletal: Normal range of motion and neck supple.     Thyroid: No thyromegaly.  Cardiovascular:     Rate and Rhythm: Normal rate  and regular rhythm.     Heart sounds: No murmur. No friction rub. No gallop.   Pulmonary:     Effort: Pulmonary effort is normal. No respiratory distress.     Breath sounds: Normal breath sounds. No wheezing  or rales.  Chest:     Breasts:        Right: No inverted nipple, mass, nipple discharge, skin change or tenderness.        Left: No inverted nipple, mass, nipple discharge, skin change or tenderness.  Abdominal:     General: Bowel sounds are normal. There is no distension.     Palpations: Abdomen is soft. There is no mass.     Tenderness: There is no abdominal tenderness. There is no guarding or rebound.  Musculoskeletal: Normal range of motion.        General: No swelling or tenderness.  Lymphadenopathy:     Cervical: No cervical adenopathy.     Lower Body: No right inguinal adenopathy. No left inguinal adenopathy.  Neurological:     General: No focal deficit present.     Mental Status: She is alert and oriented to person, place, and time.     Cranial Nerves: No cranial nerve deficit.  Skin:    General: Skin is warm and dry.     Findings: No erythema or rash.  Psychiatric:        Mood and Affect: Mood normal.        Behavior: Behavior normal.        Judgment: Judgment normal.     Female chaperone present for pelvic and breast  portions of the physical exam  Results: AUDIT Questionnaire (screen for alcoholism): 0 PHQ-9: 1   Assessment: 33 y.o. [id number] female here for routine annual gynecologic examination  Plan: Problem List Items Addressed This Visit    None    Visit Diagnoses    Women's annual routine gynecological examination    -  Primary   Relevant Orders   IGP,CtNg,AptimaHPV,rfx16/18,45   Screening for depression       Screening for alcoholism       Pap smear for cervical cancer screening       Relevant Orders   IGP,CtNg,AptimaHPV,rfx16/18,45   Screen for STD (sexually transmitted disease)       Relevant Orders   IGP,CtNg,AptimaHPV,rfx16/18,45       Screening: -- Blood pressure screen normal -- Weight screening: overweight: continue to monitor -- Depression screening negative (PHQ-9) -- Nutrition: normal -- cholesterol screening: not due for screening -- osteoporosis screening: not due -- tobacco screening: not using -- alcohol screening: AUDIT questionnaire indicates low-risk usage. -- family history of breast cancer screening: done. not at high risk. -- no evidence of domestic violence or intimate partner violence. -- STD screening: gonorrhea/chlamydia NAAT collected -- pap smear collected per ASCCP guidelines -- flu vaccine received -- HPV vaccination series: not eligilbe  Thomasene Mohair, MD 10/16/2018 9:26 AM

## 2018-10-22 LAB — IGP,CTNG,APTIMAHPV,RFX16/18,45
Chlamydia, Nuc. Acid Amp: NEGATIVE
Gonococcus by Nucleic Acid Amp: NEGATIVE
HPV Aptima: NEGATIVE

## 2018-10-25 ENCOUNTER — Other Ambulatory Visit: Payer: Self-pay | Admitting: Obstetrics and Gynecology

## 2019-04-10 ENCOUNTER — Encounter: Payer: Self-pay | Admitting: Nurse Practitioner

## 2019-04-10 ENCOUNTER — Ambulatory Visit (INDEPENDENT_AMBULATORY_CARE_PROVIDER_SITE_OTHER): Payer: Managed Care, Other (non HMO) | Admitting: Nurse Practitioner

## 2019-04-10 ENCOUNTER — Other Ambulatory Visit: Payer: Self-pay

## 2019-04-10 DIAGNOSIS — R0981 Nasal congestion: Secondary | ICD-10-CM | POA: Diagnosis not present

## 2019-04-10 MED ORDER — GUAIFENESIN ER 600 MG PO TB12: 600.0000 mg | ORAL_TABLET | Freq: Two times a day (BID) | ORAL | Status: DC

## 2019-04-10 NOTE — Progress Notes (Signed)
Telemedicine Encounter: Disclosed to patient at start of encounter that we will provide appropriate telemedicine services.  Patient consents to be treated via phone prior to discussion. - Patient is at her home and is accessed via telephone. - Services are provided by Wilhelmina McardleLauren Jena Tegeler from [facility].  Subjective:    Patient ID: Margaret Potter, female    DOB: Jan 19, 1986, 33 y.o.   MRN: [id number]  Margaret Potter is a 33 y.o. female presenting on 04/10/2019 for Sinus Problem (nasal drainage w/ discoloration, w/ post nasal drainage and sore throat. x 3 days )  HPI Sinus Congestion Patient presents today with 3 days of increased sinus congestion and drainage.  She notes her nose was dry about 3 days ago and she used Vaseline in the openings of her nose.  Immediately after using the Vaseline she noticed increased drainage.  She has been drinking additional water and taking apple cider vinegar by mouth.  Patient attributes dry nose to wearing a mask for 8 hours a day at [employer] is an employee there.  Patient has not taken any OTC medications.  The reason for visit today is patient is noticing sinus drainage is now yellow mucus instead of clear with post nasal drainage, sore throat.  Patient denies any associated fever, chills, sweats, COVID exposure or other COVID symptoms to include loss of taste or smell, shortness of breath.  Patient is unsure how long her Vaseline container has been open.  Patient has chronic allergic rhinitis and takes loratadine and Flonase on a daily basis.  Social History   Tobacco Use  . Smoking status: Former Smoker    Packs/day: 0.10    Years: 5.00    Pack years: 0.50    Types: Cigarettes    Quit date: 09/17/2018    Years since quitting: 0.5  . Smokeless tobacco: Never Used  . Tobacco comment: Quit prior 2 years, without treatment. Pt states she's planning quit on 04/19/18  Substance Use Topics  . Alcohol use: No  . Drug use: No    Review  of Systems Per HPI unless specifically indicated above     Objective:    There were no vitals taken for this visit.  Wt Readings from Last 3 Encounters:  10/16/18 192 lb (87.1 kg)  09/11/18 194 lb (88 kg)  09/02/18 194 lb (88 kg)    Physical Exam Patient remotely monitored.  Verbal communication appropriate.  Cognition normal.   Results for orders placed or performed in visit on 10/16/18  IGP,CtNg,AptimaHPV,rfx16/18,45  Result Value Ref Range   Interpretation NILM    Category NIL    Adequacy ENDO    Clinician Provided ICD10 Comment    Performed by: Comment    Note: Comment    Test Methodology Comment    HPV Aptima Negative Negative   Chlamydia, Nuc. Acid Amp Negative Negative   Gonococcus by Nucleic Acid Amp Negative Negative      Assessment & Plan:   Problem List Items Addressed This Visit    None    Visit Diagnoses    Sinus congestion    -  Primary    Consistent with chronic allergic rhinitis, non-seasonal.  Worsening recently, possible trigger of vaseline or introduction of infectious agent with vaseline. - Today no evidence of acute sinusitis or complication.   Plan: 1. Start nasal saline spray/rinses for dry nose, improve contestion. 2. Continue antihistamine loratadine 10 mg once daily. 3. Continue fluticasone. 4. May start Mucinex OTC for  mucous relief. 5. Follow-up as needed AND in 5-7 days if symptoms are not improving or worsen to consider antibiotics.    Meds ordered this encounter  Medications  . guaiFENesin (MUCINEX) 600 MG 12 hr tablet    Sig: Take 1 tablet (600 mg total) by mouth 2 (two) times daily.    Order Specific Question:   Supervising Provider    Answer:   Olin Hauser [[id number]]    - Time spent in direct consultation with patient via telemedicine about above concerns: 10 minutes  Follow up plan: Return 5-7 days if symptoms worsen or fail to improve.  Cassell Smiles, DNP, AGPCNP-BC Adult Gerontology Primary Care Nurse  Practitioner [facility] 04/10/2019, 8:13 AM

## 2019-04-23 ENCOUNTER — Encounter: Payer: Self-pay | Admitting: Nurse Practitioner

## 2019-04-23 DIAGNOSIS — J0140 Acute pansinusitis, unspecified: Secondary | ICD-10-CM

## 2019-04-25 MED ORDER — AMOXICILLIN-POT CLAVULANATE 875-125 MG PO TABS: 1.0000 | ORAL_TABLET | Freq: Two times a day (BID) | ORAL | 0 refills | Status: AC

## 2019-07-02 IMAGING — DX DG CHEST 2V
2 series · 2 of 2 positions shown · non-contrast
Comparison: None.

CLINICAL DATA: Cough and chest tightness

EXAM:
CHEST - 2 VIEW

[chest pa]
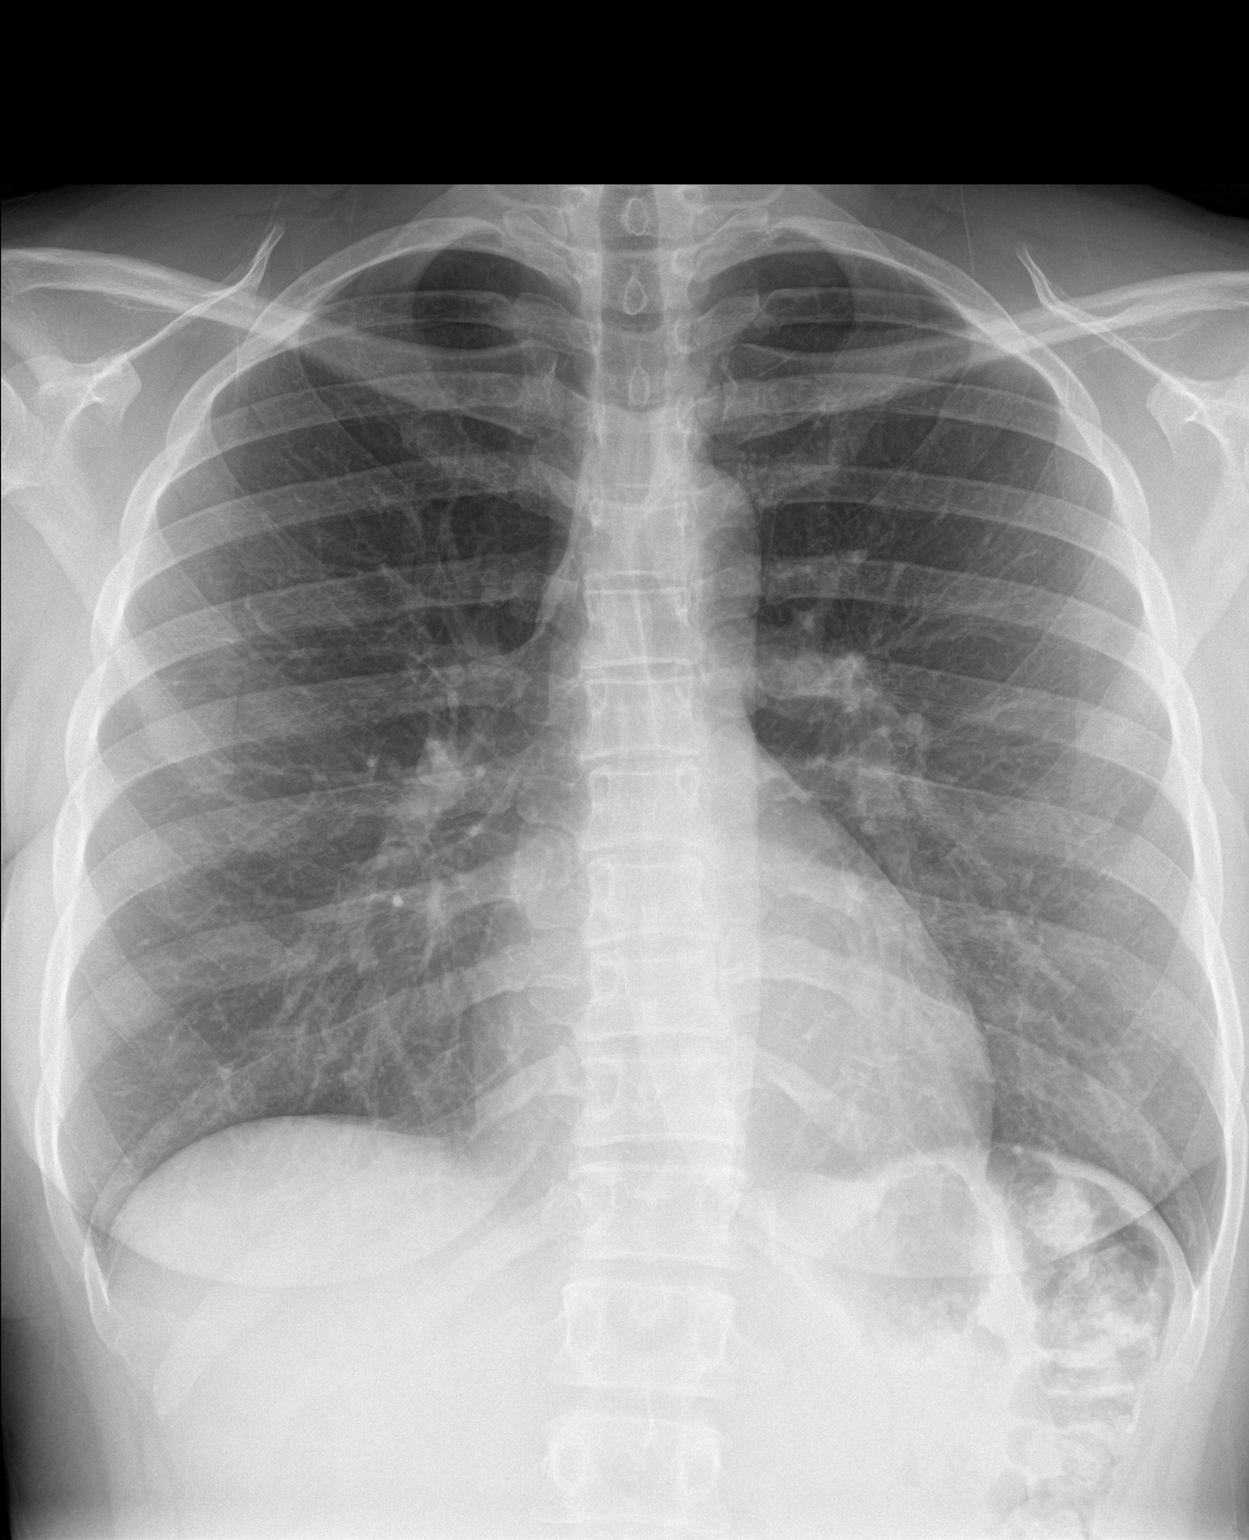

[chest lat]
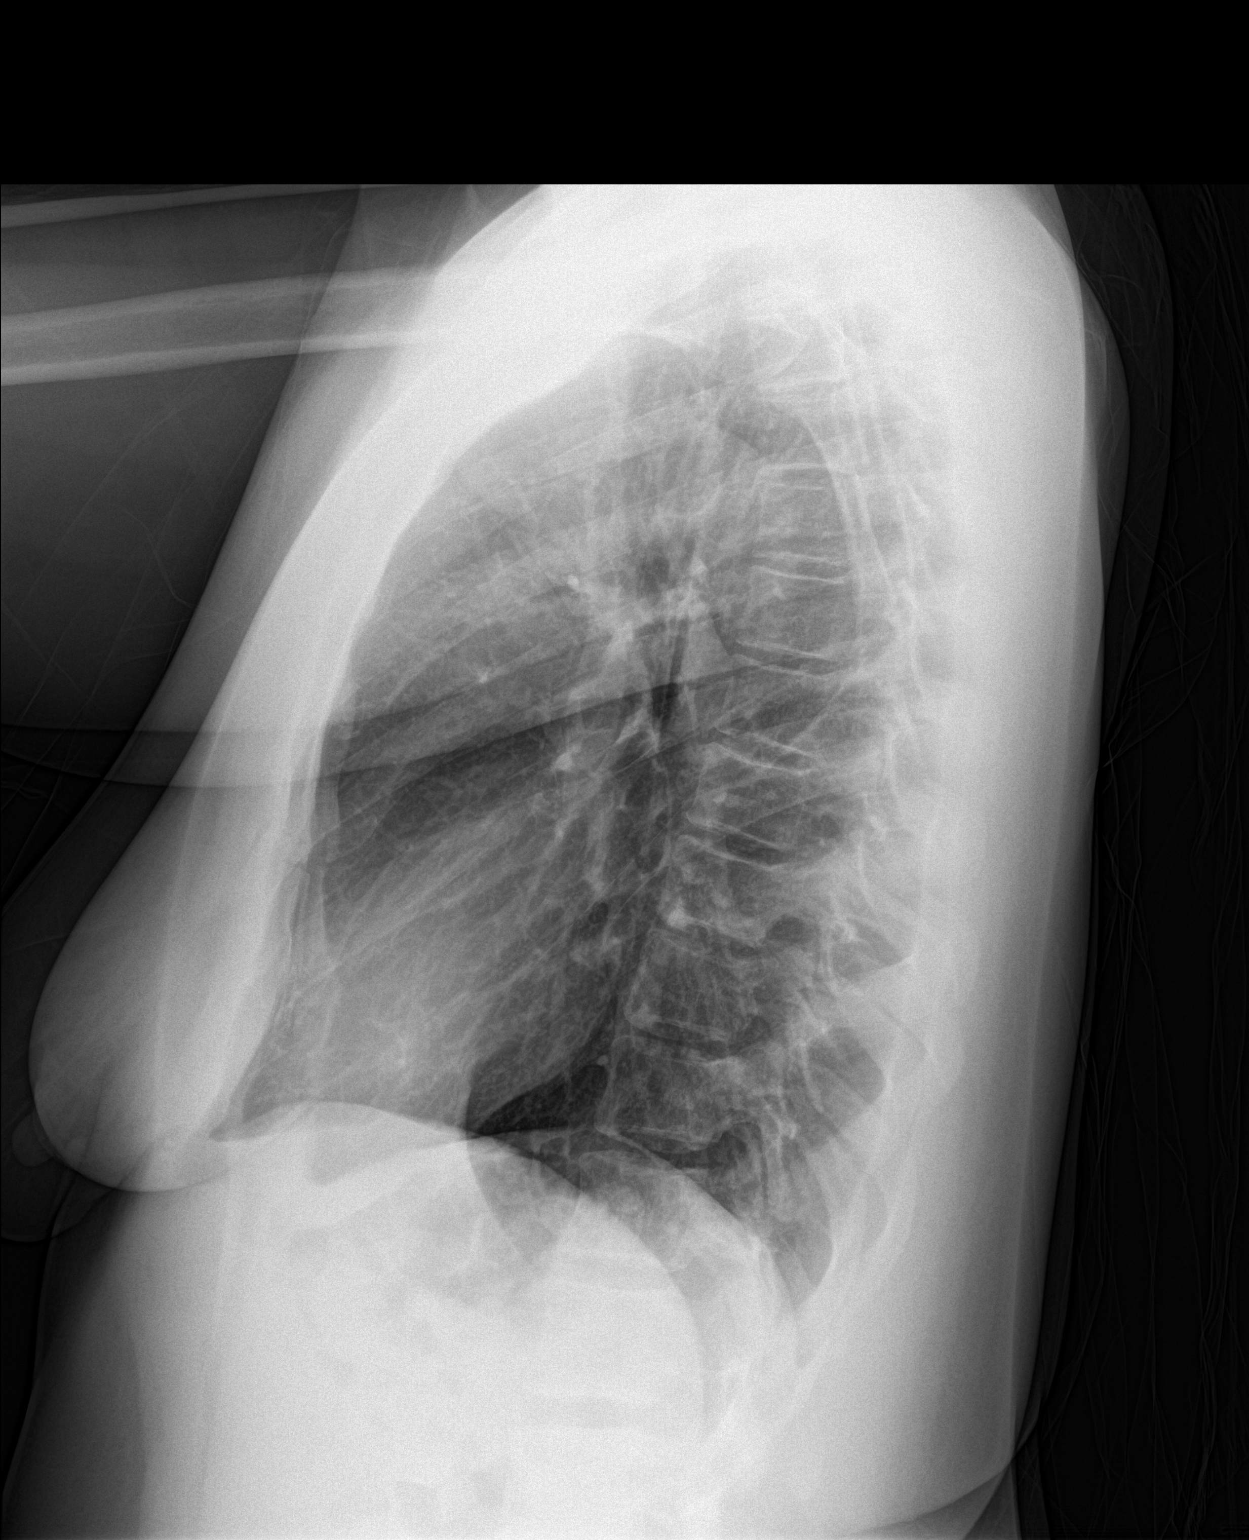

[2 of 2 positions shown; findings below may reference images not displayed]

FINDINGS: Lungs are clear. The heart size and pulmonary vascularity are
normal. No adenopathy. No pneumothorax. No bone lesions.
IMPRESSION: No edema or consolidation.

## 2019-09-30 ENCOUNTER — Telehealth: Payer: Self-pay | Admitting: Obstetrics and Gynecology

## 2019-09-30 NOTE — Telephone Encounter (Signed)
Patient is schedule for Monday, 10/28/19 at 9:50 with [facility] for Mirena placement

## 2019-09-30 NOTE — Telephone Encounter (Signed)
Noted. Will order to arrive by apt date/time. 

## 2019-10-25 NOTE — Telephone Encounter (Signed)
Mirena reserved for this patient. 

## 2019-10-28 ENCOUNTER — Other Ambulatory Visit: Payer: Self-pay

## 2019-10-28 ENCOUNTER — Ambulatory Visit (INDEPENDENT_AMBULATORY_CARE_PROVIDER_SITE_OTHER): Payer: Managed Care, Other (non HMO) | Admitting: Obstetrics and Gynecology

## 2019-10-28 ENCOUNTER — Encounter: Payer: Self-pay | Admitting: Obstetrics and Gynecology

## 2019-10-28 VITALS — BP 124/78 | Wt 225.0 lb

## 2019-10-28 DIAGNOSIS — Z30433 Encounter for removal and reinsertion of intrauterine contraceptive device: Secondary | ICD-10-CM

## 2019-10-28 MED ORDER — LEVONORGESTREL 20 MCG/24HR IU IUD: 1.0000 | INTRAUTERINE_SYSTEM | Freq: Once | INTRAUTERINE | 0 refills | Status: AC

## 2019-10-28 NOTE — Progress Notes (Signed)
    IUD Removal (Mirena) Patient identified, informed consent performed, consent signed.  Patient was in the dorsal lithotomy position, normal external genitalia was noted.  A speculum was placed in the patient's vagina, normal discharge was noted, no lesions. The cervix was visualized, no lesions, no abnormal discharge.  The strings of the IUD were grasped and pulled using ring forceps. The IUD was removed in its entirety. Patient tolerated the procedure well.    IUD Insertion Procedure Note (Mirena) Patient identified, informed consent performed, consent signed.   Discussed risks of irregular bleeding, cramping, infection, malpositioning, expulsion or uterine perforation of the IUD (1:1000 placements)  which may require further procedure such as laparoscopy.  IUD while effective at preventing pregnancy do not prevent transmission of sexually transmitted diseases and use of barrier methods for this purpose was discussed. Time out was performed.  Urine pregnancy test not done due to presence of previous IUD.  Speculum previously placed in the vagina.  Cervix already visualized.  Cleaned with Betadine x 2.  Grasped anteriorly with a single tooth tenaculum.  Uterus sounded to 8 cm. IUD placed per manufacturer's recommendations.  Strings trimmed to 3 cm. Tenaculum was removed, good hemostasis noted with application of silver nitrate.  Patient tolerated procedure well.   Patient was given post-procedure instructions.  She was advised to have backup contraception for one week.  Patient was also asked to check IUD strings periodically and follow up in 4 weeks for IUD check.  Thomasene Mohair, MD, [facility], [facility] 10/28/2019 10:22 AM

## 2019-10-28 NOTE — Patient Instructions (Signed)
Intrauterine Device Insertion, Care After  This sheet gives you information about how to care for yourself after your procedure. Your health care provider may also give you more specific instructions. If you have problems or questions, contact your health care provider. What can I expect after the procedure? After the procedure, it is common to have:  Cramps and pain in the abdomen.  Light bleeding (spotting) or heavier bleeding that is like your menstrual period. This may last for up to a few days.  Lower back pain.  Dizziness.  Headaches.  Nausea. Follow these instructions at home:  Before resuming sexual activity, check to make sure that you can feel the IUD string(s). You should be able to feel the end of the string(s) below the opening of your cervix. If your IUD string is in place, you may resume sexual activity. ? If you had a hormonal IUD inserted more than 7 days after your most recent period started, you will need to use a backup method of birth control for 7 days after IUD insertion. Ask your health care provider whether this applies to you.  Continue to check that the IUD is still in place by feeling for the string(s) after every menstrual period, or once a month.  Take over-the-counter and prescription medicines only as told by your health care provider.  Do not drive or use heavy machinery while taking prescription pain medicine.  Keep all follow-up visits as told by your health care provider. This is important. Contact a health care provider if:  You have bleeding that is heavier or lasts longer than a normal menstrual cycle.  You have a fever.  You have cramps or abdominal pain that get worse or do not get better with medicine.  You develop abdominal pain that is new or is not in the same area of earlier cramping and pain.  You feel lightheaded or weak.  You have abnormal or bad-smelling discharge from your vagina.  You have pain during sexual  activity.  You have any of the following problems with your IUD string(s): ? The string bothers or hurts you or your sexual partner. ? You cannot feel the string. ? The string has gotten longer.  You can feel the IUD in your vagina.  You think you may be pregnant, or you miss your menstrual period.  You think you may have an STI (sexually transmitted infection). Get help right away if:  You have flu-like symptoms.  You have a fever and chills.  You can feel that your IUD has slipped out of place. Summary  After the procedure, it is common to have cramps and pain in the abdomen. It is also common to have light bleeding (spotting) or heavier bleeding that is like your menstrual period.  Continue to check that the IUD is still in place by feeling for the string(s) after every menstrual period, or once a month.  Keep all follow-up visits as told by your health care provider. This is important.  Contact your health care provider if you have problems with your IUD string(s), such as the string getting longer or bothering you or your sexual partner. This information is not intended to replace advice given to you by your health care provider. Make sure you discuss any questions you have with your health care provider. Document Revised: 07/14/2017 Document Reviewed: 06/22/2016 Elsevier Patient Education  2020 [employer]  

## 2019-11-25 ENCOUNTER — Encounter: Payer: Self-pay | Admitting: Obstetrics and Gynecology

## 2019-11-25 ENCOUNTER — Other Ambulatory Visit: Payer: Self-pay

## 2019-11-25 ENCOUNTER — Ambulatory Visit (INDEPENDENT_AMBULATORY_CARE_PROVIDER_SITE_OTHER): Payer: Managed Care, Other (non HMO) | Admitting: Obstetrics and Gynecology

## 2019-11-25 VITALS — BP 122/74 | Ht 66.0 in | Wt 223.0 lb

## 2019-11-25 DIAGNOSIS — Z01419 Encounter for gynecological examination (general) (routine) without abnormal findings: Secondary | ICD-10-CM | POA: Diagnosis not present

## 2019-11-25 DIAGNOSIS — Z1339 Encounter for screening examination for other mental health and behavioral disorders: Secondary | ICD-10-CM

## 2019-11-25 DIAGNOSIS — Z30431 Encounter for routine checking of intrauterine contraceptive device: Secondary | ICD-10-CM

## 2019-11-25 DIAGNOSIS — Z1331 Encounter for screening for depression: Secondary | ICD-10-CM | POA: Diagnosis not present

## 2019-11-25 NOTE — Progress Notes (Signed)
Gynecology Annual Exam  PCP: Mikey College, NP (Inactive)  Chief Complaint  Patient presents with  . Annual Exam    History of Present Illness:  [name] is a 33 y.o. [phone] who LMP was No LMP recorded. (Menstrual status: IUD)., presents today for her annual examination.  Her menses are occasional with her IUD, lasting 2 days, when they occur.  She is not sexually active..  Last Pap: 10/16/2018  Results were: no abnormalities /neg HPV DNA negative Hx of STDs: none  There is a history of breast cancer in her maternal grandmother (~70s). There is a FH of ovarian cancer in her mother (?21s). She has been tested for BRCA 1 and 2 in 2018, and both were negative.  The patient does do self-breast exams.  Tobacco use: The patient denies current or previous tobacco use. Alcohol use: none Exercise: "not as often as I should."  The patient wears seatbelts: yes.   The patient reports that domestic violence in her life is absent.   She had a Mirena placed 4 weeks ago.  Since placement of her IUD she had a little vaginal bleeding.  She admits to cramping or discomfort in the beginning.  She has not had intercourse since placement.  She has not checked the strings.  She denies any fever, chills, nausea, vomiting, or other complaints.    Past Medical History:  Diagnosis Date  . No known health problems     Past Surgical History:  Procedure Laterality Date  . arm surgery Right    Question if hardware retained, unsure childhood surgery  . EXCISION VAGINAL CYST N/A 2015   Midline, chronic problem (inclusion cyst s/p prior suture repair from pregnancy)    Prior to Admission medications   Medication Sig Start Date End Date Taking? Authorizing Provider  fluticasone (FLONASE) 50 MCG/ACT nasal spray Place 2 sprays into both nostrils daily. 01/19/17  Yes Mikey College, NP  loratadine (CLARITIN) 10 MG tablet Take 10 mg by mouth daily as needed for allergies.   Yes  [provider]  levonorgestrel (MIRENA) 20 MCG/24HR IUD 1 Intra Uterine Device (1 each total) by Intrauterine route once for 1 dose. 10/28/19 10/28/19  Will Bonnet, MD    Allergies  Allergen Reactions  . Azithromycin Hives   Obstetric History: [id number]  Social History   Socioeconomic History  . Marital status: Single    Spouse name: Not on file  . Number of children: 3  . Years of education: College  . Highest education level: Not on file  Occupational History  . Occupation: Armed forces logistics/support/administrative officer  Tobacco Use  . Smoking status: Former Smoker    Packs/day: 0.10    Years: 5.00    Pack years: 0.50    Types: Cigarettes    Quit date: 09/17/2018    Years since quitting: 1.1  . Smokeless tobacco: Never Used  . Tobacco comment: Quit prior 2 years, without treatment. Pt states she's planning quit on 04/19/18  Substance and Sexual Activity  . Alcohol use: No  . Drug use: No  . Sexual activity: Not Currently    Birth control/protection: I.U.D.  Other Topics Concern  . Not on file  Social History Narrative  . Not on file   Social Determinants of Health   Financial Resource Strain:   . Difficulty of Paying Living Expenses:   Food Insecurity:   . Worried About Charity fundraiser in the Last Year:   .  Ran Out of Food in the Last Year:   Transportation Needs:   . Film/video editor (Medical):   Marland Kitchen Lack of Transportation (Non-Medical):   Physical Activity:   . Days of Exercise per Week:   . Minutes of Exercise per Session:   Stress:   . Feeling of Stress :   Social Connections:   . Frequency of Communication with Friends and Family:   . Frequency of Social Gatherings with Friends and Family:   . Attends Religious Services:   . Active Member of Clubs or Organizations:   . Attends Archivist Meetings:   Marland Kitchen Marital Status:   Intimate Partner Violence:   . Fear of Current or Ex-Partner:   . Emotionally Abused:   Marland Kitchen Physically Abused:   . Sexually Abused:      Family History  Problem Relation Age of Onset  . Arthritis Mother   . Cancer Mother        cervical  . Depression Mother   . Diabetes Mother   . Heart disease Mother   . Hyperlipidemia Mother   . Hypertension Mother   . Stroke Mother   . Depression Father   . Ulcers Father   . Depression Brother   . Arthritis Maternal Grandmother   . Asthma Maternal Grandmother   . Cancer Maternal Grandmother        breast  . Depression Maternal Grandmother   . Diabetes Maternal Grandmother   . Heart disease Maternal Grandmother   . Hypertension Maternal Grandmother   . Stroke Maternal Grandmother   . Vision loss Maternal Grandmother   . Arthritis Maternal Grandfather   . Alcohol abuse Maternal Grandfather   . Cancer Maternal Grandfather   . Hypertension Maternal Grandfather   . Stroke Maternal Grandfather   . Heart attack Maternal Grandfather   . Arthritis Paternal Grandmother   . Diabetes Paternal Grandmother   . Hypertension Paternal Grandmother   . Vision loss Paternal Grandmother   . Ovarian cancer Neg Hx   . Colon cancer Neg Hx     Review of Systems  Constitutional: Negative.   HENT: Negative.   Eyes: Negative.   Respiratory: Negative.   Cardiovascular: Negative.   Gastrointestinal: Negative.   Genitourinary: Negative.   Musculoskeletal: Negative.   Skin: Negative.   Neurological: Negative.   Psychiatric/Behavioral: Negative.      Physical Exam BP 122/74   Ht 5' 6"  (1.676 m)   Wt 223 lb (101.2 kg)   BMI 35.99 kg/m    Physical Exam Constitutional:      General: She is not in acute distress.    Appearance: Normal appearance. She is well-developed.  Genitourinary:     Pelvic exam was performed with patient in the lithotomy position.     Vulva, urethra, bladder and uterus normal.     No inguinal adenopathy present in the right or left side.    No signs of injury in the vagina.     No vaginal discharge, erythema, tenderness or bleeding.     No cervical  motion tenderness, discharge, lesion or polyp.     IUD strings visualized.     Uterus is mobile.     Uterus is not enlarged or tender.     No uterine mass detected.    Uterus is anteverted.     No right or left adnexal mass present.     Right adnexa not tender or full.     Left adnexa not tender or full.  HENT:     Head: Normocephalic and atraumatic.  Eyes:     General: No scleral icterus.    Conjunctiva/sclera: Conjunctivae normal.  Neck:     Thyroid: No thyromegaly.  Cardiovascular:     Rate and Rhythm: Normal rate and regular rhythm.     Heart sounds: No murmur. No friction rub. No gallop.   Pulmonary:     Effort: Pulmonary effort is normal. No respiratory distress.     Breath sounds: Normal breath sounds. No wheezing or rales.  Chest:     Breasts:        Right: No inverted nipple, mass, nipple discharge, skin change or tenderness.        Left: No inverted nipple, mass, nipple discharge, skin change or tenderness.  Abdominal:     General: Bowel sounds are normal. There is no distension.     Palpations: Abdomen is soft. There is no mass.     Tenderness: There is no abdominal tenderness. There is no guarding or rebound.  Musculoskeletal:        General: No swelling or tenderness. Normal range of motion.     Cervical back: Normal range of motion and neck supple.  Lymphadenopathy:     Cervical: No cervical adenopathy.     Lower Body: No right inguinal adenopathy. No left inguinal adenopathy.  Neurological:     General: No focal deficit present.     Mental Status: She is alert and oriented to person, place, and time.     Cranial Nerves: No cranial nerve deficit.  Skin:    General: Skin is warm and dry.     Findings: No erythema or rash.  Psychiatric:        Mood and Affect: Mood normal.        Behavior: Behavior normal.        Judgment: Judgment normal.     Female chaperone present for pelvic and breast  portions of the physical exam  Results: AUDIT Questionnaire  (screen for alcoholism): 0 PHQ-9: 0   Assessment: 34 y.o. [id number] female here for routine annual gynecologic examination  Plan: Problem List Items Addressed This Visit    None    Visit Diagnoses    Women's annual routine gynecological examination    -  Primary   Screening for depression       Screening for alcoholism       IUD check up          Screening: -- Blood pressure screen normal -- Weight screening: overweight: continue to monitor -- Depression screening negative (PHQ-9) -- Nutrition: normal -- cholesterol screening: not due for screening -- osteoporosis screening: not due -- tobacco screening: not using -- alcohol screening: AUDIT questionnaire indicates low-risk usage. -- family history of breast cancer screening: done. not at high risk. -- no evidence of domestic violence or intimate partner violence. -- STD screening: gonorrhea/chlamydia NAAT not collected per patient request. -- pap smear not collected per ASCCP guidelines  Family history of breast and ovarian cancer: negative BRCA1/2 testing in 2018 through [facility]. Discussed panel testing for more genes involved in ovarian cancer.  She will get a better understanding of her mom's ovarian cancer and we can decide if more in-depth testing is warranted.   IUD appears to be in place and working well.   Prentice Docker, MD 11/25/2019 9:16 AM

## 2020-05-04 ENCOUNTER — Telehealth: Payer: Self-pay

## 2020-05-04 ENCOUNTER — Encounter: Payer: Self-pay | Admitting: Family Medicine

## 2020-05-04 ENCOUNTER — Ambulatory Visit: Payer: Managed Care, Other (non HMO) | Admitting: Family Medicine

## 2020-05-04 ENCOUNTER — Telehealth: Payer: Managed Care, Other (non HMO) | Admitting: Family Medicine

## 2020-05-04 NOTE — Telephone Encounter (Signed)
Pt calling triage needing a form filled out for work. She was wondering if she could use Dr Jean Rosenthal as her PCP. I advised her I would ask him if we can fill out the form since she is technically up to date on her GYN annual, but he doesn't do much primary care and we stopped doing that here at [facility]. I will let her know on Wednesday if we could fill out the form. He is back in the office that day

## 2020-05-07 NOTE — Telephone Encounter (Signed)
Did not have a chance to ask Dr Jean Rosenthal about this yesterday d/t our schedule and him being on BCALL and having to go to hospital. Will ask him when he is in the office next

## 2020-05-11 ENCOUNTER — Encounter: Payer: Self-pay | Admitting: Family Medicine

## 2020-05-11 ENCOUNTER — Ambulatory Visit (INDEPENDENT_AMBULATORY_CARE_PROVIDER_SITE_OTHER): Payer: Managed Care, Other (non HMO) | Admitting: Family Medicine

## 2020-05-11 ENCOUNTER — Other Ambulatory Visit: Payer: Self-pay

## 2020-05-11 VITALS — BP 105/58 | Temp 98.8°F | Resp 18 | Ht 66.0 in | Wt 228.4 lb

## 2020-05-11 DIAGNOSIS — E669 Obesity, unspecified: Secondary | ICD-10-CM

## 2020-05-11 NOTE — Patient Instructions (Signed)
Please send [place] a copy of your most recent lab work so we can upload into your chart.  We will plan to see you back in 1-3 months for your physical  You will receive a survey after today's visit either digitally by e-mail or paper by USPS mail. Your experiences and feedback matter to [place].  Please respond so we know how we are doing as we provide care for you.  Call us with any questions/concerns/needs.  It is my goal to be available to you for your health concerns.  Thanks for choosing me to be a partner in your healthcare needs!  Charlaine Dalton, FNP-C Family Nurse Practitioner [facility] Phone: [phone]

## 2020-05-11 NOTE — Progress Notes (Signed)
Subjective:    Patient ID: Margaret Potter, female    DOB: 11-23-1985, 34 y.o.   MRN: [id number]  Margaret Potter is a 34 y.o. female presenting on 05/11/2020 for Consult (pt would like to get an appeal form filled out for [facility] )   HPI  Margaret Potter presents to clinic today to discuss an appeal form for her employer due to her elevated BMI above their program parameters.  She currently has a BMI of 36.86%, increased from last appeal form visit on 04/11/2018 when her BMI was 33.4%.  Has a goal of obtaining a weight of 180lbs.  She has a Photographer with [employer], does go hiking at least once per week, she is paying attention to the foods that she is eating and increasing her exercise.  Has quit smoking successfully since her last appeal visit on 04/11/2018.  She has no other acute concerns today.  Depression screen [facility] 2/9 05/11/2020 04/11/2018 04/05/2017  Decreased Interest 0 0 0  Down, Depressed, Hopeless 0 1 0  PHQ - 2 Score 0 1 0  Altered sleeping - - 0  Tired, decreased energy - - 0  Change in appetite - - 0  Feeling bad or failure about yourself  - - 0  Trouble concentrating - - 0  Moving slowly or fidgety/restless - - 0  Suicidal thoughts - - 0  PHQ-9 Score - - 0    Social History   Tobacco Use  . Smoking status: Former Smoker    Packs/day: 0.10    Years: 5.00    Pack years: 0.50    Types: Cigarettes    Quit date: 09/17/2018    Years since quitting: 1.6  . Smokeless tobacco: Never Used  . Tobacco comment: Quit prior 2 years, without treatment. Pt states she's planning quit on 04/19/18  Vaping Use  . Vaping Use: Never used  Substance Use Topics  . Alcohol use: No  . Drug use: No    Review of Systems  Constitutional: Negative.   HENT: Negative.   Eyes: Negative.   Respiratory: Negative.   Cardiovascular: Negative.   Gastrointestinal: Negative.   Endocrine: Negative.   Genitourinary: Negative.   Musculoskeletal: Negative.   Skin: Negative.     Allergic/Immunologic: Negative.   Neurological: Negative.   Hematological: Negative.   Psychiatric/Behavioral: Negative.    Per HPI unless specifically indicated above     Objective:    BP (!) 105/58 (BP Location: Right Arm, Patient Position: Sitting, Cuff Size: Large)   Temp 98.8 F (37.1 C) (Oral)   Resp 18   Ht 5\' 6"  (1.676 m)   Wt 228 lb 6.4 oz (103.6 kg)   SpO2 100%   BMI 36.86 kg/m   Wt Readings from Last 3 Encounters:  05/11/20 228 lb 6.4 oz (103.6 kg)  11/25/19 223 lb (101.2 kg)  10/28/19 225 lb (102.1 kg)    Physical Exam Vitals reviewed.  Constitutional:      General: She is not in acute distress.    Appearance: Normal appearance. She is well-developed and well-groomed. She is obese. She is not ill-appearing or toxic-appearing.  HENT:     Head: Normocephalic and atraumatic.     Nose:     Comments: 10/30/19 is in place, covering mouth and nose. Eyes:     General: Lids are normal. Vision grossly intact.        Right eye: No discharge.        Left eye: No  discharge.     Extraocular Movements: Extraocular movements intact.     Conjunctiva/sclera: Conjunctivae normal.     Pupils: Pupils are equal, round, and reactive to light.  Cardiovascular:     Rate and Rhythm: Normal rate and regular rhythm.     Pulses: Normal pulses.     Heart sounds: Normal heart sounds. No murmur heard.  No friction rub. No gallop.   Pulmonary:     Effort: Pulmonary effort is normal. No respiratory distress.     Breath sounds: Normal breath sounds.  Musculoskeletal:     Right lower leg: No edema.     Left lower leg: No edema.  Skin:    General: Skin is warm and dry.     Capillary Refill: Capillary refill takes less than 2 seconds.  Neurological:     General: No focal deficit present.     Mental Status: She is alert and oriented to person, place, and time.  Psychiatric:        Attention and Perception: Attention and perception normal.        Mood and Affect: Mood and affect  normal.        Speech: Speech normal.        Behavior: Behavior normal. Behavior is cooperative.        Thought Content: Thought content normal.        Cognition and Memory: Cognition and memory normal.        Judgment: Judgment normal.    Results for orders placed or performed in visit on 10/16/18  IGP,CtNg,AptimaHPV,rfx16/18,45  Result Value Ref Range   Interpretation NILM    Category NIL    Adequacy ENDO    Clinician Provided ICD10 Comment    Performed by: Comment    Note: Comment    Test Methodology Comment    HPV Aptima Negative Negative   Chlamydia, Nuc. Acid Amp Negative Negative   Gonococcus by Nucleic Acid Amp Negative Negative      Assessment & Plan:   Problem List Items Addressed This Visit      Other   Obesity (BMI 35.0-39.9 without comorbidity) - Primary    BMI 36.86% (goal of < 30 for [facility] biometrics), motivated and is working towards increasing her exercise and water intake along with decreasing her caloric intake.  Does hike at least once per week and has a gym membership with [employer].  Has obtained an app to track caloric intake as well. -Family History of DM, HTN, HLD.  History of weight gain up to 300lbs, but with diet and exercise was able to lose 100lbs. -No labs available to review for A1C, Lipids  Plan: 1. Failed [facility] biometric screening, goal BMI <30, patient goal weight of 180lbs.  Discussed plan of increasing exercise and decreasing caloric intake.  To try and exercise most day of the week and track calories through an app or notebook 2. Will request copies of recent labs with [facility] for review and to upload into chart 3. To RTC in 1-3 months for CPE         No orders of the defined types were placed in this encounter.  Follow up plan: Return in about 3 months (around 08/10/2020) for CPE.   Charlaine Dalton, FNP Family Nurse Practitioner [facility] 05/11/2020, 11:37 AM

## 2020-05-11 NOTE — Assessment & Plan Note (Signed)
BMI 36.86% (goal of < 30 for [facility] biometrics), motivated and is working towards increasing her exercise and water intake along with decreasing her caloric intake.  Does hike at least once per week and has a gym membership with [employer].  Has obtained an app to track caloric intake as well. -Family History of DM, HTN, HLD.  History of weight gain up to 300lbs, but with diet and exercise was able to lose 100lbs. -No labs available to review for A1C, Lipids  Plan: 1. Failed [facility] biometric screening, goal BMI <30, patient goal weight of 180lbs.  Discussed plan of increasing exercise and decreasing caloric intake.  To try and exercise most day of the week and track calories through an app or notebook 2. Will request copies of recent labs with [facility] for review and to upload into chart 3. To RTC in 1-3 months for CPE

## 2020-05-22 NOTE — Telephone Encounter (Signed)
Best if pt gets this from PCP. Tried to call pt. No answer. Please let me know if she returns my call

## 2020-09-11 ENCOUNTER — Other Ambulatory Visit: Payer: Managed Care, Other (non HMO)

## 2020-09-11 DIAGNOSIS — Z20822 Contact with and (suspected) exposure to covid-19: Secondary | ICD-10-CM

## 2020-09-12 LAB — SARS-COV-2, NAA 2 DAY TAT

## 2020-09-12 LAB — NOVEL CORONAVIRUS, NAA: SARS-CoV-2, NAA: NOT DETECTED

## 2020-09-22 ENCOUNTER — Encounter: Payer: Managed Care, Other (non HMO) | Admitting: Unknown Physician Specialty

## 2020-11-27 ENCOUNTER — Encounter: Payer: Self-pay | Admitting: Emergency Medicine

## 2020-11-27 ENCOUNTER — Other Ambulatory Visit: Payer: Self-pay

## 2020-11-27 ENCOUNTER — Ambulatory Visit
Admission: EM | Admit: 2020-11-27 | Discharge: 2020-11-27 | Disposition: A | Discharge status: Home / Self Care | Payer: Managed Care, Other (non HMO) | Attending: Sports Medicine | Admitting: Sports Medicine

## 2020-11-27 DIAGNOSIS — R350 Frequency of micturition: Secondary | ICD-10-CM | POA: Diagnosis not present

## 2020-11-27 DIAGNOSIS — N760 Acute vaginitis: Secondary | ICD-10-CM | POA: Diagnosis present

## 2020-11-27 DIAGNOSIS — R300 Dysuria: Secondary | ICD-10-CM | POA: Insufficient documentation

## 2020-11-27 DIAGNOSIS — B9689 Other specified bacterial agents as the cause of diseases classified elsewhere: Secondary | ICD-10-CM | POA: Insufficient documentation

## 2020-11-27 DIAGNOSIS — N898 Other specified noninflammatory disorders of vagina: Secondary | ICD-10-CM | POA: Diagnosis present

## 2020-11-27 LAB — URINALYSIS, COMPLETE (UACMP) WITH MICROSCOPIC
Bilirubin Urine: NEGATIVE
Glucose, UA: NEGATIVE mg/dL
Hgb urine dipstick: NEGATIVE
Ketones, ur: NEGATIVE mg/dL
Leukocytes,Ua: NEGATIVE
Nitrite: NEGATIVE
Protein, ur: NEGATIVE mg/dL
Specific Gravity, Urine: 1.02 (ref 1.005–1.030)
pH: 7.5 (ref 5.0–8.0)

## 2020-11-27 LAB — WET PREP, GENITAL
Sperm: NONE SEEN
Trich, Wet Prep: NONE SEEN
Yeast Wet Prep HPF POC: NONE SEEN

## 2020-11-27 MED ORDER — METRONIDAZOLE 500 MG PO TABS: 500.0000 mg | ORAL_TABLET | Freq: Two times a day (BID) | ORAL | 0 refills | Status: DC

## 2020-11-27 NOTE — ED Triage Notes (Signed)
Patient in today c/o vaginal pain and discomfort, vaginal discharge and urinary frequency x 1 day. Patient has not taken any OTC medications for her symptoms. Patient is not concerned about STDs.

## 2020-11-27 NOTE — Discharge Instructions (Signed)
Please see educational handouts. The wet prep showed clue cells consistent with bacterial vaginosis. I have sent in a medication for this to the pharmacy. Your urine does not show a urinary tract infection.  There are few bacteria which is probably a contaminant.  I did send off the culture.  If it does grow something, someone will contact you and put you on an antibiotic. For now plenty of water and flush your system.

## 2020-11-28 NOTE — ED Provider Notes (Signed)
[facility]    CSN: [id number] Arrival date & time: 11/27/20  1959      History   Chief Complaint Chief Complaint  Patient presents with  . Vaginal Pain  . Vaginal Discharge  . Urinary Frequency    HPI Margaret Potter is a 35 y.o. female.   Patient is a pleasant 35 year old female who presents for evaluation of the above issues.  She reports dysuria, increased urinary frequency and urgency, vaginal discharge that is whitish in nature and foul-smelling for 2 days now.  She denies any current sexual activity and is not concerned about STDs.  [place] care provider [place] in [place].  She denies any significant abdominal pain.  No flank pain.  No hematuria.  No fever shakes chills.  No nausea vomiting diarrhea.  No chest pain or shortness of breath.  No red flag signs or symptoms elicited on history.     Past Medical History:  Diagnosis Date  . No known health problems     Patient Active Problem List   Diagnosis Date Noted  . Obesity (BMI 35.0-39.9 without comorbidity) 06/01/2016  . Environmental and seasonal allergies 06/01/2016  . Contraception, device intrauterine 06/01/2016  . Tobacco use 06/01/2016  . History of anemia 07/16/2014    Past Surgical History:  Procedure Laterality Date  . arm surgery Right    Question if hardware retained, unsure childhood surgery  . 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 Authorizing Provider  Biotin 1 MG CAPS Take by mouth.   Yes [provider]  Cholecalciferol (VITAMIN D3) 125 MCG (5000 UT) CAPS Take by mouth.   Yes [provider]   fluticasone (FLONASE) 50 MCG/ACT nasal spray Place 2 sprays into both nostrils daily. 01/19/17  Yes Galen ManilaKennedy, Lauren Renee, NP  ibuprofen (ADVIL,MOTRIN) 200 MG tablet Take 200-400 mg by mouth every 6 (six) hours as needed for headache or cramping.   Yes [provider]  levonorgestrel (MIRENA) 20 MCG/24HR IUD 1 Intra Uterine Device (1 each total) by Intrauterine route once for 1 dose. 10/28/19 10/28/19 Yes Conard NovakJackson, Stephen D, MD  loratadine (CLARITIN) 10 MG tablet Take 10 mg by mouth daily as needed for allergies.   Yes [provider]  metroNIDAZOLE (FLAGYL) 500 MG tablet Take 1 tablet (500 mg total) by mouth 2 (two) times daily. 11/27/20  Yes Delton SeeBarnes, Dakotah Orrego, MD  Multiple Vitamin (MULTIVITAMIN) tablet Take 1 tablet by mouth daily.   Yes [provider]    Family History Family History  Problem Relation Age of Onset  . Arthritis Mother   . Cancer Mother        cervical  . Depression Mother   . Diabetes Mother   . Heart disease Mother   . Hyperlipidemia Mother   . Hypertension Mother   . Stroke Mother   . Depression  Father   . Ulcers Father   . Depression Brother   . Arthritis Maternal Grandmother   . Asthma Maternal Grandmother   . Cancer Maternal Grandmother        breast  . Depression Maternal Grandmother   . Diabetes Maternal Grandmother   . Heart disease Maternal Grandmother   . Hypertension Maternal Grandmother   . Stroke Maternal Grandmother   . Vision loss Maternal Grandmother   . Arthritis Maternal Grandfather   . Alcohol abuse Maternal Grandfather   . Cancer Maternal Grandfather   . Hypertension Maternal Grandfather   . Stroke Maternal Grandfather   . Heart attack Maternal Grandfather   . Arthritis Paternal Grandmother   . Diabetes Paternal Grandmother   . Hypertension Paternal Grandmother   . Vision loss Paternal Grandmother   . Ovarian cancer Neg Hx   . Colon cancer Neg Hx     Social History Social History   Tobacco Use  . Smoking  status: Former Smoker    Packs/day: 0.10    Years: 5.00    Pack years: 0.50    Types: Cigarettes    Quit date: 09/17/2018    Years since quitting: 2.2  . Smokeless tobacco: Never Used  . Tobacco comment: Quit prior 2 years, without treatment. Pt states she's planning quit on 04/19/18  Vaping Use  . Vaping Use: Never used  Substance Use Topics  . Alcohol use: No  . Drug use: No     Allergies   Azithromycin   Review of Systems Review of Systems  Constitutional: Negative.  Negative for activity change, appetite change, chills, diaphoresis and fever.  HENT: Negative.  Negative for congestion, ear pain, rhinorrhea and sinus pain.   Eyes: Negative.  Negative for pain.  Respiratory: Negative.  Negative for cough and shortness of breath.   Cardiovascular: Negative.  Negative for chest pain and palpitations.  Gastrointestinal: Negative for abdominal pain, constipation, diarrhea, nausea and vomiting.  Genitourinary: Positive for dysuria, frequency, urgency, vaginal discharge and vaginal pain. Negative for flank pain, hematuria, pelvic pain and vaginal bleeding.  Musculoskeletal: Negative.  Negative for arthralgias and myalgias.  Skin: Negative.  Negative for color change, pallor, rash and wound.  Neurological: Negative.  Negative for dizziness and headaches.  All other systems reviewed and are negative.    Physical Exam Triage Vital Signs ED Triage Vitals  Enc Vitals Group     BP 11/27/20 2011 117/74     Pulse Rate 11/27/20 2011 83     Resp 11/27/20 2011 18     Temp 11/27/20 2011 98.7 F (37.1 C)     Temp Source 11/27/20 2011 Oral     SpO2 11/27/20 2011 100 %     Weight 11/27/20 2011 230 lb (104.3 kg)     Height 11/27/20 2011 5\' 6"  (1.676 m)     Head Circumference --      Peak Flow --      Pain Score 11/27/20 2010 6     Pain Loc --      Pain Edu? --      Excl. in GC? --    No data found.  Updated Vital Signs BP 117/74 (BP Location: Left Arm)   Pulse 83   Temp 98.7 F  (37.1 C) (Oral)   Resp 18   Ht 5\' 6"  (1.676 m)   Wt 104.3 kg   SpO2 100%   BMI 37.12 kg/m   Visual Acuity Right Eye Distance:   Left Eye Distance:  Bilateral Distance:    Right Eye Near:   Left Eye Near:    Bilateral Near:     Physical Exam Vitals and nursing note reviewed.  Constitutional:      General: She is not in acute distress.    Appearance: Normal appearance. She is not ill-appearing, toxic-appearing or diaphoretic.  HENT:     Head: Normocephalic and atraumatic.  Eyes:     General: No scleral icterus.       Right eye: No discharge.        Left eye: No discharge.     Extraocular Movements: Extraocular movements intact.     Conjunctiva/sclera: Conjunctivae normal.     Pupils: Pupils are equal, round, and reactive to light.  Cardiovascular:     Rate and Rhythm: Normal rate and regular rhythm.     Pulses: Normal pulses.     Heart sounds: Normal heart sounds.  Pulmonary:     Effort: Pulmonary effort is normal.     Breath sounds: Normal breath sounds.  Abdominal:     General: There is no distension.     Palpations: Abdomen is soft.     Tenderness: There is no abdominal tenderness. There is no right CVA tenderness, left CVA tenderness, guarding or rebound.  Musculoskeletal:     Cervical back: Normal range of motion and neck supple.  Skin:    General: Skin is warm and dry.     Capillary Refill: Capillary refill takes less than 2 seconds.  Neurological:     General: No focal deficit present.     Mental Status: She is alert and oriented to person, place, and time.      UC Treatments / Results  Labs (all labs ordered are listed, but only abnormal results are displayed) Labs Reviewed  WET PREP, GENITAL - Abnormal; Notable for the following components:      Result Value   Clue Cells Wet Prep HPF POC PRESENT (*)    WBC, Wet Prep HPF POC MANY (*)    All other components within normal limits  URINALYSIS, COMPLETE (UACMP) WITH MICROSCOPIC - Abnormal; Notable  for the following components:   Bacteria, UA RARE (*)    All other components within normal limits  URINE CULTURE    EKG   Radiology No results found.  Procedures Procedures (including critical care time)  Medications Ordered in UC Medications - No data to display  Initial Impression / Assessment and Plan / UC Course  I have reviewed the triage vital signs and the nursing notes.  Pertinent labs & imaging results that were available during my care of the patient were reviewed by me and considered in my medical decision making (see chart for details).  Clinical impression: Dysuria, increased urinary frequency and urgency, with vaginal discharge and fishy odor for 2 days.  No abdominal pain no fevers.  Treatment plan: 1.  The findings and treatment plan were discussed in detail with the patient.  Patient was in agreement. 2.  We will check a UA.  Results show rare bacteria.  Probably contaminant.  Given her symptoms we will just send for culture.  No antibiotics. 3.  Obtained a wet prep does show clue cells and many bacteria.  Will treat for bacterial vaginosis with Flagyl.  Sent into her pharmacy. 4.  Educational handouts provided. 5.  Plenty of rest, plenty fluids, Tylenol or Motrin for any fever or discomfort. 6.  If her symptoms were to worsen she should seek out attention in  the ER.  She should also follow-up with her primary care physician if her symptoms persist. 7.  Offered a work note she did need 1. 8.  She was stable when discharged from the urgent care and she will follow-up with [place] as needed.    Final Clinical Impressions(s) / UC Diagnoses   Final diagnoses:  Urinary frequency  Dysuria  BV (bacterial vaginosis)  Vaginal discharge     Discharge Instructions     Please see educational handouts. The wet prep showed clue cells consistent with bacterial vaginosis. I have sent in a medication for this to the pharmacy. Your urine does not show a urinary tract  infection.  There are few bacteria which is probably a contaminant.  I did send off the culture.  If it does grow something, someone will contact you and put you on an antibiotic. For now plenty of water and flush your system.    ED Prescriptions    Medication Sig Dispense Auth. Provider   metroNIDAZOLE (FLAGYL) 500 MG tablet Take 1 tablet (500 mg total) by mouth 2 (two) times daily. 14 tablet Delton See, MD     PDMP not reviewed this encounter.   Delton See, MD 11/28/20 2155

## 2020-11-29 LAB — URINE CULTURE: Culture: NO GROWTH

## 2021-01-13 ENCOUNTER — Other Ambulatory Visit: Payer: Self-pay

## 2021-01-13 ENCOUNTER — Ambulatory Visit
Admission: RE | Admit: 2021-01-13 | Discharge: 2021-01-13 | Disposition: A | Discharge status: Home / Self Care | Payer: Managed Care, Other (non HMO) | Source: Ambulatory Visit | Attending: Family Medicine | Admitting: Family Medicine

## 2021-01-13 VITALS — BP 119/72 | HR 71 | Temp 98.1°F | Resp 18 | Ht 66.0 in | Wt 229.9 lb

## 2021-01-13 DIAGNOSIS — B373 Candidiasis of vulva and vagina: Secondary | ICD-10-CM | POA: Diagnosis not present

## 2021-01-13 DIAGNOSIS — B3731 Acute candidiasis of vulva and vagina: Secondary | ICD-10-CM

## 2021-01-13 LAB — WET PREP, GENITAL
Clue Cells Wet Prep HPF POC: NONE SEEN
Sperm: NONE SEEN
Trich, Wet Prep: NONE SEEN

## 2021-01-13 LAB — URINALYSIS, COMPLETE (UACMP) WITH MICROSCOPIC
Bacteria, UA: NONE SEEN
Bilirubin Urine: NEGATIVE
Glucose, UA: NEGATIVE mg/dL
Hgb urine dipstick: NEGATIVE
Ketones, ur: NEGATIVE mg/dL
Nitrite: NEGATIVE
Protein, ur: NEGATIVE mg/dL
Specific Gravity, Urine: 1.02 (ref 1.005–1.030)
pH: 7 (ref 5.0–8.0)

## 2021-01-13 MED ORDER — FLUCONAZOLE 150 MG PO TABS: 150.0000 mg | ORAL_TABLET | Freq: Once | ORAL | 0 refills | Status: AC

## 2021-01-13 NOTE — ED Triage Notes (Signed)
Patient c/o white vaginal discharge, itching that started yesterday. She also reports dysuria that started yesterday.

## 2021-01-13 NOTE — ED Provider Notes (Signed)
[facility]    CSN: [id number] Arrival date & time: 01/13/21  1856      History   Chief Complaint Chief Complaint  Patient presents with  . Vaginal Discharge  . Vaginal Itching       . Dysuria   HPI   35 year old female presents with vaginal discharge.  Started yesterday.  She reports vaginal discharge and itching.  She reports some odor.  She has had recent sexual intercourse.  Denies concerns for STD.  No medications or interventions tried.  No relieving factors.  No abdominal pain.  No back pain.  Denies dysuria to me.  No other complaints.  Past Medical History:  Diagnosis Date  . No known health problems     Patient Active Problem List   Diagnosis Date Noted  . Obesity (BMI 35.0-39.9 without comorbidity) 06/01/2016  . Environmental and seasonal allergies 06/01/2016  . Contraception, device intrauterine 06/01/2016  . Tobacco use 06/01/2016  . History of anemia 07/16/2014    Past Surgical History:  Procedure Laterality Date  . arm surgery Right    Question if hardware retained, unsure childhood surgery  . 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 Authorizing Provider  Biotin 1 MG CAPS Take by mouth.   Yes [provider]  Cholecalciferol (VITAMIN D3) 125 MCG (5000 UT) CAPS Take by mouth.   Yes [provider]  fluconazole (DIFLUCAN) 150 MG tablet Take 1 tablet (150 mg total) by mouth once for 1 dose. Repeat dose in 72 hours. 01/13/21 01/13/21 Yes Bular Hickok G, DO  fluticasone (FLONASE) 50 MCG/ACT nasal spray Place 2 sprays into both nostrils daily. 01/19/17  Yes Galen Manila, NP   Multiple Vitamin (MULTIVITAMIN) tablet Take 1 tablet by mouth daily.   Yes [provider]  ibuprofen (ADVIL,MOTRIN) 200 MG tablet Take 200-400 mg by mouth every 6 (six) hours as needed for headache or cramping.    [provider]  levonorgestrel (MIRENA) 20 MCG/24HR IUD 1 Intra Uterine Device (1 each total) by Intrauterine route once for 1 dose. 10/28/19 10/28/19  Conard Novak, MD  loratadine (CLARITIN) 10 MG tablet Take 10 mg by mouth daily as needed for allergies.    [provider]    Family History Family History  Problem Relation Age of Onset  . Arthritis Mother   . Cancer Mother        cervical  . Depression Mother   . Diabetes Mother   . Heart disease Mother   . Hyperlipidemia Mother   . Hypertension Mother   . Stroke Mother   . Depression Father   . Ulcers Father   . Depression Brother   . Arthritis Maternal Grandmother   . Asthma Maternal Grandmother   . Cancer Maternal Grandmother        breast  . Depression Maternal Grandmother   .  Diabetes Maternal Grandmother   . Heart disease Maternal Grandmother   . Hypertension Maternal Grandmother   . Stroke Maternal Grandmother   . Vision loss Maternal Grandmother   . Arthritis Maternal Grandfather   . Alcohol abuse Maternal Grandfather   . Cancer Maternal Grandfather   . Hypertension Maternal Grandfather   . Stroke Maternal Grandfather   . Heart attack Maternal Grandfather   . Arthritis Paternal Grandmother   . Diabetes Paternal Grandmother   . Hypertension Paternal Grandmother   . Vision loss Paternal Grandmother   . Ovarian cancer Neg Hx   . Colon cancer Neg Hx     Social History Social History   Tobacco Use  . Smoking status: Former Smoker    Packs/day: 0.10    Years: 5.00    Pack years: 0.50    Types: Cigarettes    Quit date: 09/17/2018    Years since quitting: 2.3  . Smokeless tobacco: Never Used  . Tobacco comment: Quit prior 2 years, without treatment. Pt states she's  planning quit on 04/19/18  Vaping Use  . Vaping Use: Never used  Substance Use Topics  . Alcohol use: No  . Drug use: No     Allergies   Azithromycin   Review of Systems Review of Systems Per HPI  Physical Exam Triage Vital Signs ED Triage Vitals  Enc Vitals Group     BP 01/13/21 1909 119/72     Pulse Rate 01/13/21 1909 71     Resp 01/13/21 1909 18     Temp 01/13/21 1909 98.1 F (36.7 C)     Temp Source 01/13/21 1909 Oral     SpO2 01/13/21 1909 100 %     Weight 01/13/21 1908 229 lb 15 oz (104.3 kg)     Height 01/13/21 1908 5\' 6"  (1.676 m)     Head Circumference --      Peak Flow --      Pain Score 01/13/21 1908 0     Pain Loc --      Pain Edu? --      Excl. in GC? --    Updated Vital Signs BP 119/72 (BP Location: Left Arm)   Pulse 71   Temp 98.1 F (36.7 C) (Oral)   Resp 18   Ht 5\' 6"  (1.676 m)   Wt 104.3 kg   SpO2 100%   BMI 37.11 kg/m   Visual Acuity Right Eye Distance:   Left Eye Distance:   Bilateral Distance:    Right Eye Near:   Left Eye Near:    Bilateral Near:     Physical Exam Vitals and nursing note reviewed.  Constitutional:      General: She is not in acute distress.    Appearance: Normal appearance. She is not ill-appearing.  HENT:     Head: Normocephalic and atraumatic.  Eyes:     General:        Right eye: No discharge.        Left eye: No discharge.     Conjunctiva/sclera: Conjunctivae normal.  Pulmonary:     Effort: Pulmonary effort is normal. No respiratory distress.  Abdominal:     General: There is no distension.     Palpations: Abdomen is soft.     Tenderness: There is no abdominal tenderness.  Neurological:     Mental Status: She is alert.  Psychiatric:        Mood and Affect: Mood normal.  Behavior: Behavior normal.    UC Treatments / Results  Labs (all labs ordered are listed, but only abnormal results are displayed) Labs Reviewed  WET PREP, GENITAL - Abnormal; Notable for the following components:       Result Value   Yeast Wet Prep HPF POC PRESENT (*)    WBC, Wet Prep HPF POC FEW (*)    All other components within normal limits  URINALYSIS, COMPLETE (UACMP) WITH MICROSCOPIC - Abnormal; Notable for the following components:   Leukocytes,Ua SMALL (*)    All other components within normal limits    EKG   Radiology No results found.  Procedures Procedures (including critical care time)  Medications Ordered in UC Medications - No data to display  Initial Impression / Assessment and Plan / UC Course  I have reviewed the triage vital signs and the nursing notes.  Pertinent labs & imaging results that were available during my care of the patient were reviewed by me and considered in my medical decision making (see chart for details).    35 year old female presents with yeast vaginitis.  Treating with Diflucan.  Final Clinical Impressions(s) / UC Diagnoses   Final diagnoses:  Yeast vaginitis   Discharge Instructions   None    ED Prescriptions    Medication Sig Dispense Auth. Provider   fluconazole (DIFLUCAN) 150 MG tablet Take 1 tablet (150 mg total) by mouth once for 1 dose. Repeat dose in 72 hours. 2 tablet Tommie Sams, DO     PDMP not reviewed this encounter.   Tommie Sams, [place] 01/13/21 1953

## 2021-01-14 ENCOUNTER — Ambulatory Visit: Payer: Managed Care, Other (non HMO) | Admitting: Internal Medicine

## 2021-03-02 ENCOUNTER — Ambulatory Visit: Payer: Managed Care, Other (non HMO) | Admitting: Internal Medicine

## 2021-03-03 ENCOUNTER — Other Ambulatory Visit: Payer: Self-pay

## 2021-03-03 ENCOUNTER — Encounter: Payer: Self-pay | Admitting: Internal Medicine

## 2021-03-03 ENCOUNTER — Ambulatory Visit (INDEPENDENT_AMBULATORY_CARE_PROVIDER_SITE_OTHER): Payer: Managed Care, Other (non HMO) | Admitting: Internal Medicine

## 2021-03-03 VITALS — BP 110/66 | HR 78 | Temp 98.0°F | Resp 18 | Ht 66.0 in | Wt 242.8 lb

## 2021-03-03 DIAGNOSIS — Z6839 Body mass index (BMI) 39.0-39.9, adult: Secondary | ICD-10-CM | POA: Diagnosis not present

## 2021-03-03 DIAGNOSIS — F411 Generalized anxiety disorder: Secondary | ICD-10-CM | POA: Insufficient documentation

## 2021-03-03 DIAGNOSIS — E6609 Other obesity due to excess calories: Secondary | ICD-10-CM | POA: Insufficient documentation

## 2021-03-03 DIAGNOSIS — F419 Anxiety disorder, unspecified: Secondary | ICD-10-CM | POA: Insufficient documentation

## 2021-03-03 DIAGNOSIS — F4321 Adjustment disorder with depressed mood: Secondary | ICD-10-CM | POA: Diagnosis not present

## 2021-03-03 DIAGNOSIS — E66813 Obesity, class 3: Secondary | ICD-10-CM | POA: Insufficient documentation

## 2021-03-03 MED ORDER — SERTRALINE HCL 50 MG PO TABS: ORAL_TABLET | ORAL | 2 refills | Status: DC

## 2021-03-03 NOTE — Patient Instructions (Signed)
Complicated Grief Grief is a normal response to the death of someone close to you. Feelings of fear, anger, and guilt can affect almost everyone who loses a loved one. It is also common to have symptoms of depression while you are grieving. These include problems with sleep, loss of appetite, and lack of energy. They maylast for weeks or months after a loss. Complicated grief is different from normal grief or depression. Normal grieving involves sadness and feelings of loss, but those feelings get better and heal over time. Complicated grief is a severe type of grief that lasts for a long time, usually for several months to a year or longer. It interferes with your ability to function normally. Complicated grief may require treatment from amental health care provider. What are the causes? The cause of this condition is not known. It is not clear why some peoplecontinue to struggle with grief and others do not. What increases the risk? You are more likely to develop this condition if: The death of your loved one was sudden or unexpected. The death of your loved one was due to a violent event. Your loved one died from suicide. Your loved one was a child or a young person. You were very close to your loved one, or you were dependent on him or her. You have a history of depression or anxiety. What are the signs or symptoms? Symptoms of this condition include: Feeling disbelief or having a lack of emotion (numbness). Being unable to enjoy good memories of your loved one. Needing to avoid anything or anyone that reminds you of your loved one. Being unable to stop thinking about the death. Feeling intense anger or guilt. Feeling alone and hopeless. Feeling that your life is meaningless and empty. Losing the desire to move on with your life. How is this diagnosed? This condition may be diagnosed based on: Your symptoms. Complicated grief will be diagnosed if you have ongoing symptoms of grief for  6-12 months or longer. The effect of symptoms on your life. You may be diagnosed with this condition if your symptoms are interfering with your ability to live your life. Your health care provider may recommend that you see a mental health care provider. Many symptoms of depression are similar to the symptoms of complicated grief. It is important to be evaluated for complicated grief alongwith other mental health conditions. How is this treated? This condition is most commonly treated with talk therapy. This therapy is offered by a mental health specialist (psychiatrist). During therapy: You will learn healthy ways to cope with the loss of your loved one. Your mental health care provider may recommend antidepressant medicines. Follow these instructions at home: Lifestyle  Take care of yourself. Eat on a regular basis, and maintain a healthy diet. Eat plenty of fruits, vegetables, lean protein, and whole grains. Try to get some exercise each day. Aim for 30 minutes of exercise on most days of the week. Keep a consistent sleep schedule. Try to get 8 or more hours of sleep each night. Start doing the things that you used to enjoy. Do not use drugs or alcohol to ease your symptoms. Spend time with friends and loved ones.  General instructions Take over-the-counter and prescription medicines only as told by your health care provider. Consider joining a grief (bereavement) support group to help you deal with your loss. Keep all follow-up visits as told by your health care provider. This is important. Contact a health care provider if: Your symptoms prevent   you from functioning normally. Your symptoms do not get better with treatment. Get help right away if: You have serious thoughts about hurting yourself or someone else. You have suicidal feelings. If you ever feel like you may hurt yourself or others, or have thoughts about taking your own life, get help right away. You can go to your nearest  emergency department or call: Your local emergency services (911 in the [place] A suicide crisis helpline, such as the National Suicide Prevention Lifeline at 1-800-273-8255. This is open 24 hours a day. Summary Complicated grief is a severe type of grief that lasts for a long time. This grief is not likely to go away on its own. Get the help you need. Some griefs are more difficult than others and can cause this condition. You may need a certain type of treatment to help you recover if the loss of your loved one was sudden, violent, or due to suicide. You may feel guilty about moving on with your life. Getting help does not mean that you are forgetting your loved one. It means that you are taking care of yourself. Complicated grief is best treated with talk therapy. Medicines may also be prescribed. Seek the help you need, and find support that will help you recover. This information is not intended to replace advice given to you by your health care provider. Make sure you discuss any questions you have with your healthcare provider. Document Revised: 01/23/2020 Document Reviewed: 01/23/2020 Elsevier Patient Education  2022 [employer]  

## 2021-03-03 NOTE — Assessment & Plan Note (Signed)
Will monitor and discuss more at annual exam

## 2021-03-03 NOTE — Progress Notes (Signed)
Subjective:    Patient ID: Margaret Potter, female    DOB: [birth year], 35 y.o.   MRN: [id number]  HPI  Pt presents to the clinic today with c/o depression.  She reports this started 1 week ago after the loss of her mother.  She reports her mother suddenly got ill June 28.  She went to the hospital.  She was in the ICU.  She died 1 week ago.  Since that time, she reports moderate episode of depression.  She reports her sleep is improving.  Her appetite seems to be improving.  She has no motivation to really do anything.  She is feeling anxious and overwhelmed.  She does have a support group among family and friends.  She has started seeing a therapist.  She is going to [facility] today to meet with a grief counselor.  She has not been to work since 6/28 and will need FMLA form completion.  She does report history of depression as a teenager and in the postpartum period but never took medications for this.  She denies SI/HI.  Review of Systems     Past Medical History:  Diagnosis Date   No known health problems     Current Outpatient Medications  Medication Sig Dispense Refill   Biotin 1 MG CAPS Take by mouth.     Cholecalciferol (VITAMIN D3) 125 MCG (5000 UT) CAPS Take by mouth.     fluticasone (FLONASE) 50 MCG/ACT nasal spray Place 2 sprays into both nostrils daily. 16 g 6   ibuprofen (ADVIL,MOTRIN) 200 MG tablet Take 200-400 mg by mouth every 6 (six) hours as needed for headache or cramping.     levonorgestrel (MIRENA) 20 MCG/24HR IUD 1 Intra Uterine Device (1 each total) by Intrauterine route once for 1 dose. 1 each 0   loratadine (CLARITIN) 10 MG tablet Take 10 mg by mouth daily as needed for allergies.     Multiple Vitamin (MULTIVITAMIN) tablet Take 1 tablet by mouth daily.     No current facility-administered medications for this visit.    Allergies  Allergen Reactions   Azithromycin Hives    Family History  Problem Relation Age of Onset   Arthritis Mother     Cancer Mother        cervical   Depression Mother    Diabetes Mother    Heart disease Mother    Hyperlipidemia Mother    Hypertension Mother    Stroke Mother    Depression Father    Ulcers Father    Depression Brother    Arthritis Maternal Grandmother    Asthma Maternal Grandmother    Cancer Maternal Grandmother        breast   Depression Maternal Grandmother    Diabetes Maternal Grandmother    Heart disease Maternal Grandmother    Hypertension Maternal Grandmother    Stroke Maternal Grandmother    Vision loss Maternal Grandmother    Arthritis Maternal Grandfather    Alcohol abuse Maternal Grandfather    Cancer Maternal Grandfather    Hypertension Maternal Grandfather    Stroke Maternal Grandfather    Heart attack Maternal Grandfather    Arthritis Paternal Grandmother    Diabetes Paternal Grandmother    Hypertension Paternal Grandmother    Vision loss Paternal Grandmother    Ovarian cancer Neg Hx    Colon cancer Neg Hx     Social History   Socioeconomic History   Marital status: Single    Spouse name:  Not on file   Number of children: 3   Years of education: College   Highest education level: Not on file  Occupational History   Occupation: [employer] Employee  Tobacco Use   Smoking status: Former    Packs/day: 0.10    Years: 5.00    Pack years: 0.50    Types: Cigarettes    Quit date: 09/17/2018    Years since quitting: 2.4   Smokeless tobacco: Never   Tobacco comments:    Quit prior 2 years, without treatment. Pt states she's planning quit on 04/19/18  Vaping Use   Vaping Use: Never used  Substance and Sexual Activity   Alcohol use: No   Drug use: No   Sexual activity: Not Currently    Birth control/protection: I.U.D.  Other Topics Concern   Not on file  Social History Narrative   Not on file   Social Determinants of Health   Financial Resource Strain: Not on file  Food Insecurity: Not on file  Transportation Needs: Not on file  Physical Activity: Not  on file  Stress: Not on file  Social Connections: Not on file  Intimate Partner Violence: Not on file     Constitutional: Denies fever, malaise, fatigue, headache or abrupt weight changes.  Respiratory: Denies difficulty breathing, shortness of breath, cough or sputum production.   Cardiovascular: Denies chest pain, chest tightness, palpitations or swelling in the hands or feet.  Gastrointestinal: Denies abdominal pain, bloating, constipation, diarrhea or blood in the stool.  Neurological: Denies dizziness, difficulty with memory, difficulty with speech or problems with balance and coordination.  Psych: Pt reports depression. Denies anxiety, SI/HI.  No other specific complaints in a complete review of systems (except as listed in HPI above).  Objective:   Physical Exam  BP 110/66 (BP Location: Right Arm, Patient Position: Sitting, Cuff Size: Large)   Pulse 78   Temp 98 F (36.7 C) (Temporal)   Resp 18   Ht 5\' 6"  (1.676 m)   Wt 242 lb 12.8 oz (110.1 kg)   SpO2 100%   BMI 39.19 kg/m   Wt Readings from Last 3 Encounters:  01/13/21 229 lb 15 oz (104.3 kg)  11/27/20 230 lb (104.3 kg)  05/11/20 228 lb 6.4 oz (103.6 kg)    General: Appears her stated age, obese, in NAD. Skin: Warm, dry and intact.  Cardiovascular: Normal rate. Pulmonary/Chest: Normal effort. Musculoskeletal: No difficulty with gait.  Neurological: Alert and oriented.  Psychiatric: Mood and affect flat. Tearful. Judgment and thought content normal.     BMET    Component Value Date/Time   NA 140 04/05/2017 1142   K 5.0 04/05/2017 1142   CL 103 04/05/2017 1142   CO2 24 04/05/2017 1142   GLUCOSE 79 04/05/2017 1142   BUN 8 04/05/2017 1142   CREATININE 0.83 04/05/2017 1142   CALCIUM 9.2 04/05/2017 1142   GFRNONAA 94 04/05/2017 1142   GFRNONAA 99 04/14/2016 0000   GFRAA 109 04/05/2017 1142   GFRAA 114 04/14/2016 0000    Lipid Panel     Component Value Date/Time   CHOL 127 03/15/2018 0000   CHOL  115 04/14/2016 0000   TRIG 36 04/14/2016 0000   HDL 50 03/15/2018 0000   CHOLHDL 2.2 04/14/2016 0000   VLDL 7 04/14/2016 0000   LDLCALC 67 03/15/2018 0000   LDLCALC 56 04/14/2016 0000    CBC    Component Value Date/Time   WBC 4.9 04/05/2017 1142   WBC 7.0 04/26/2014 2052  RBC 4.53 04/05/2017 1142   RBC 4.04 04/26/2014 2052   HGB 12.8 04/05/2017 1142   HCT 38.9 04/05/2017 1142   PLT 316 04/05/2017 1142   MCV 86 04/05/2017 1142   MCV 75 (L) 04/26/2014 2052   MCH 28.3 04/05/2017 1142   MCH 24.1 (L) 04/26/2014 2052   MCHC 32.9 04/05/2017 1142   MCHC 32.1 04/26/2014 2052   RDW 13.5 04/05/2017 1142   RDW 14.3 04/26/2014 2052   LYMPHSABS 1.9 04/05/2017 1142   LYMPHSABS 1.1 04/26/2014 2052   MONOABS 0.7 04/26/2014 2052   EOSABS 0.1 04/05/2017 1142   EOSABS 0.1 04/26/2014 2052   BASOSABS 0.0 04/05/2017 1142   BASOSABS 0.0 04/26/2014 2052    Hgb A1C Lab Results  Component Value Date   HGBA1C 5.2 03/15/2018           Assessment & Plan:    Nicki Reaper, NP This visit occurred during the SARS-CoV-2 public health emergency.  Safety protocols were in place, including screening questions prior to the visit, additional usage of staff PPE, and extensive cleaning of exam room while observing appropriate contact time as indicated for disinfecting solutions.

## 2021-03-03 NOTE — Assessment & Plan Note (Signed)
Secondary to the loss of her mother She is currently meeting with a therapist and has plans to meet with a grief counselor, encouraged her to do this We will trial Sertraline 50 mg daily She does have a strong support system Support offered  RTC in 3 months for your annual exam

## 2021-04-05 ENCOUNTER — Telehealth (INDEPENDENT_AMBULATORY_CARE_PROVIDER_SITE_OTHER): Payer: Self-pay | Admitting: Internal Medicine

## 2021-04-05 ENCOUNTER — Other Ambulatory Visit: Payer: Self-pay

## 2021-04-05 DIAGNOSIS — Z5329 Procedure and treatment not carried out because of patient's decision for other reasons: Secondary | ICD-10-CM

## 2021-04-05 DIAGNOSIS — Z91199 Patient's noncompliance with other medical treatment and regimen due to unspecified reason: Secondary | ICD-10-CM

## 2021-04-05 NOTE — Progress Notes (Signed)
Pt unable to be reached for appt

## 2021-05-05 ENCOUNTER — Other Ambulatory Visit: Payer: Self-pay | Admitting: Internal Medicine

## 2021-05-05 MED ORDER — SERTRALINE HCL 50 MG PO TABS: ORAL_TABLET | ORAL | 0 refills | Status: DC

## 2021-05-05 NOTE — Telephone Encounter (Signed)
Copied from CRM [id number]. Topic: Quick Communication - Rx Refill/Question >> May 05, 2021  9:06 AM Jaquita Rector A wrote: Medication: sertraline (ZOLOFT) 50 MG tablet   Has the patient contacted their pharmacy? Yes.   (Agent: If no, request that the patient contact the pharmacy for the refill.) (Agent: If yes, when and what did the pharmacy advise?)  Preferred Pharmacy (with phone number or street name): [id number] [place], [place] - [address] AT [facility] OF [address] [facility]  Phone:  [phone] Fax:  [phone]    Has the patient been seen for an appointment in the last year OR does the patient have an upcoming appointment? Yes.    Agent: Please be advised that RX refills may take up to 3 business days. We ask that you follow-up with your pharmacy.

## 2021-06-02 ENCOUNTER — Ambulatory Visit (INDEPENDENT_AMBULATORY_CARE_PROVIDER_SITE_OTHER): Payer: Managed Care, Other (non HMO) | Admitting: Internal Medicine

## 2021-06-02 ENCOUNTER — Other Ambulatory Visit: Payer: Self-pay

## 2021-06-02 ENCOUNTER — Encounter: Payer: Self-pay | Admitting: Internal Medicine

## 2021-06-02 VITALS — BP 123/79 | HR 85 | Temp 98.0°F | Resp 18 | Ht 66.0 in | Wt 260.0 lb

## 2021-06-02 DIAGNOSIS — F32A Depression, unspecified: Secondary | ICD-10-CM

## 2021-06-02 DIAGNOSIS — Z0001 Encounter for general adult medical examination with abnormal findings: Secondary | ICD-10-CM

## 2021-06-02 DIAGNOSIS — Z23 Encounter for immunization: Secondary | ICD-10-CM

## 2021-06-02 DIAGNOSIS — Z1159 Encounter for screening for other viral diseases: Secondary | ICD-10-CM | POA: Diagnosis not present

## 2021-06-02 DIAGNOSIS — Z6839 Body mass index (BMI) 39.0-39.9, adult: Secondary | ICD-10-CM

## 2021-06-02 DIAGNOSIS — F419 Anxiety disorder, unspecified: Secondary | ICD-10-CM | POA: Diagnosis not present

## 2021-06-02 DIAGNOSIS — N898 Other specified noninflammatory disorders of vagina: Secondary | ICD-10-CM | POA: Diagnosis not present

## 2021-06-02 DIAGNOSIS — E6609 Other obesity due to excess calories: Secondary | ICD-10-CM | POA: Diagnosis not present

## 2021-06-02 MED ORDER — SERTRALINE HCL 100 MG PO TABS: 100.0000 mg | ORAL_TABLET | Freq: Every day | ORAL | 0 refills | Status: DC

## 2021-06-02 MED ORDER — HYDROXYZINE HCL 10 MG PO TABS: 10.0000 mg | ORAL_TABLET | Freq: Every day | ORAL | 0 refills | Status: DC | PRN

## 2021-06-02 NOTE — Addendum Note (Signed)
Addended by: Lonna Cobb on: 06/02/2021 01:17 PM   Modules accepted: Orders

## 2021-06-02 NOTE — Progress Notes (Signed)
Subjective:    Patient ID: Margaret Potter, female    DOB: [birth year], 35 y.o.   MRN: [id number]  HPI  Patient presents the clinic today for her annual exam.  She would also like to follow-up anxiety and depression.  Anxiety and Depression: After the loss of her mother.  She reports her depression is better but she seems to have high anxiety at times.  She is taking the sertraline as prescribed with some relief.  She would like to have FMLA intermittently for times that she she has periods of high anxiety and just needs to step away from work for an hour or 2.  Flu: Never Tetanus: Unsure Covid: never Pap Smear: 03/2017 Dentist: biannually  Diet: She does eat meat. She consumees fruits and veggies. She does eat some fried foods. She drinks mostly coffee. Exercise: None  Review of Systems  Past Medical History:  Diagnosis Date   No known health problems     Current Outpatient Medications  Medication Sig Dispense Refill   Biotin 1 MG CAPS Take by mouth.     Cholecalciferol (VITAMIN D3) 125 MCG (5000 UT) CAPS Take by mouth.     fluticasone (FLONASE) 50 MCG/ACT nasal spray Place 2 sprays into both nostrils daily. 16 g 6   loratadine (CLARITIN) 10 MG tablet Take 10 mg by mouth daily as needed for allergies.     Multiple Vitamin (MULTIVITAMIN) tablet Take 1 tablet by mouth daily.     sertraline (ZOLOFT) 50 MG tablet Take 0.5 tablets (25 mg total) by mouth daily for 10 days, THEN 1 tablet (50 mg total) daily. (Patient taking differently: take 1 tablet (50 mg total) daily.) 30 tablet 0   levonorgestrel (MIRENA) 20 MCG/24HR IUD 1 Intra Uterine Device (1 each total) by Intrauterine route once for 1 dose. 1 each 0   No current facility-administered medications for this visit.    Allergies  Allergen Reactions   Azithromycin Hives    Family History  Problem Relation Age of Onset   Arthritis Mother    Cancer Mother        cervical   Depression Mother    Diabetes Mother     Heart disease Mother    Hyperlipidemia Mother    Hypertension Mother    Stroke Mother    Depression Father    Ulcers Father    Depression Brother    Arthritis Maternal Grandmother    Asthma Maternal Grandmother    Cancer Maternal Grandmother        breast   Depression Maternal Grandmother    Diabetes Maternal Grandmother    Heart disease Maternal Grandmother    Hypertension Maternal Grandmother    Stroke Maternal Grandmother    Vision loss Maternal Grandmother    Arthritis Maternal Grandfather    Alcohol abuse Maternal Grandfather    Cancer Maternal Grandfather    Hypertension Maternal Grandfather    Stroke Maternal Grandfather    Heart attack Maternal Grandfather    Arthritis Paternal Grandmother    Diabetes Paternal Grandmother    Hypertension Paternal Grandmother    Vision loss Paternal Grandmother    Ovarian cancer Neg Hx    Colon cancer Neg Hx     Social History   Socioeconomic History   Marital status: Single    Spouse name: Not on file   Number of children: 3   Years of education: College   Highest education level: Not on file  Occupational History   Occupation: [employer]  Employee  Tobacco Use   Smoking status: Former    Packs/day: 0.10    Years: 5.00    Pack years: 0.50    Types: Cigarettes    Quit date: 09/17/2018    Years since quitting: 2.7   Smokeless tobacco: Never   Tobacco comments:    Quit prior 2 years, without treatment. Pt states she's planning quit on 04/19/18  Vaping Use   Vaping Use: Never used  Substance and Sexual Activity   Alcohol use: No   Drug use: No   Sexual activity: Not Currently    Birth control/protection: I.U.D.  Other Topics Concern   Not on file  Social History Narrative   Not on file   Social Determinants of Health   Financial Resource Strain: Not on file  Food Insecurity: Not on file  Transportation Needs: Not on file  Physical Activity: Not on file  Stress: Not on file  Social Connections: Not on file  Intimate  Partner Violence: Not on file     Constitutional: Denies fever, malaise, fatigue, headache or abrupt weight changes.  HEENT: Denies eye pain, eye redness, ear pain, ringing in the ears, wax buildup, runny nose, nasal congestion, bloody nose, or sore throat. Respiratory: Denies difficulty breathing, shortness of breath, cough or sputum production.   Cardiovascular: Denies chest pain, chest tightness, palpitations or swelling in the hands or feet.  Gastrointestinal: Denies abdominal pain, bloating, constipation, diarrhea or blood in the stool.  GU: Patient reports vaginal itching, odor and discharge.  Denies urgency, frequency, pain with urination, burning sensation, blood in urine. Musculoskeletal: Denies decrease in range of motion, difficulty with gait, muscle pain or joint pain and swelling.  Skin: Denies redness, rashes, lesions or ulcercations.  Neurological: Denies dizziness, difficulty with memory, difficulty with speech or problems with balance and coordination.  Psych: Patient has a history of anxiety and depression.  Denies SI/HI.  No other specific complaints in a complete review of systems (except as listed in HPI above).     Objective:   Physical Exam   BP 123/79 (BP Location: Right Arm, Patient Position: Sitting, Cuff Size: Large)   Pulse 85   Temp 98 F (36.7 C) (Temporal)   Resp 18   Ht 5' 6"  (1.676 m)   Wt 260 lb (117.9 kg)   SpO2 100%   BMI 41.97 kg/m  Wt Readings from Last 3 Encounters:  06/02/21 260 lb (117.9 kg)  03/03/21 242 lb 12.8 oz (110.1 kg)  01/13/21 229 lb 15 oz (104.3 kg)    General: Appears her stated age, obese, in NAD. Skin: Warm, dry and intact. No rashes, lesions or ulcerations noted. HEENT: Head: normal shape and size; Eyes: EOMs intact;  Neck:  Neck supple, trachea midline. No masses, lumps or thyromegaly present.  Cardiovascular: Normal rate and rhythm. S1,S2 noted.  No murmur, rubs or gallops noted. No JVD or BLE edema.   Pulmonary/Chest: Normal effort and positive vesicular breath sounds. No respiratory distress. No wheezes, rales or ronchi noted.  Abdomen: Soft and nontender. Normal bowel sounds. No distention or masses noted. Liver, spleen and kidneys non palpable. Musculoskeletal: Strength 5/5 BUE/BLE.  No difficulty with gait.  Neurological: Alert and oriented. Cranial nerves II-XII grossly intact. Coordination normal.  Psychiatric: Mood and affect normal. Behavior is normal. Judgment and thought content normal.     BMET    Component Value Date/Time   NA 140 04/05/2017 1142   K 5.0 04/05/2017 1142   CL 103 04/05/2017 1142  CO2 24 04/05/2017 1142   GLUCOSE 79 04/05/2017 1142   BUN 8 04/05/2017 1142   CREATININE 0.83 04/05/2017 1142   CALCIUM 9.2 04/05/2017 1142   GFRNONAA 94 04/05/2017 1142   GFRNONAA 99 04/14/2016 0000   GFRAA 109 04/05/2017 1142   GFRAA 114 04/14/2016 0000    Lipid Panel     Component Value Date/Time   CHOL 127 03/15/2018 0000   CHOL 115 04/14/2016 0000   TRIG 36 04/14/2016 0000   HDL 50 03/15/2018 0000   CHOLHDL 2.2 04/14/2016 0000   VLDL 7 04/14/2016 0000   LDLCALC 67 03/15/2018 0000   LDLCALC 56 04/14/2016 0000    CBC    Component Value Date/Time   WBC 4.9 04/05/2017 1142   WBC 7.0 04/26/2014 2052   RBC 4.53 04/05/2017 1142   RBC 4.04 04/26/2014 2052   HGB 12.8 04/05/2017 1142   HCT 38.9 04/05/2017 1142   PLT 316 04/05/2017 1142   MCV 86 04/05/2017 1142   MCV 75 (L) 04/26/2014 2052   MCH 28.3 04/05/2017 1142   MCH 24.1 (L) 04/26/2014 2052   MCHC 32.9 04/05/2017 1142   MCHC 32.1 04/26/2014 2052   RDW 13.5 04/05/2017 1142   RDW 14.3 04/26/2014 2052   LYMPHSABS 1.9 04/05/2017 1142   LYMPHSABS 1.1 04/26/2014 2052   MONOABS 0.7 04/26/2014 2052   EOSABS 0.1 04/05/2017 1142   EOSABS 0.1 04/26/2014 2052   BASOSABS 0.0 04/05/2017 1142   BASOSABS 0.0 04/26/2014 2052    Hgb A1C Lab Results  Component Value Date   HGBA1C 5.2 03/15/2018            Assessment & Plan:   Preventative Health Maintenance:  Flu shot today Tetanus today Encouraged her to get her COVID-vaccine Pap smear due 2023 Encouraged her to consume a balanced diet and exercise regimen Advised her to see an eye doctor and dentist annually We will check CBC, c-Met, TSH, lipid, A1c and hep C today  Vaginal Itching, Odor and Discharge:  Will obtain wet prep  RTC in 1 year, sooner if needed Webb Silversmith, NP This visit occurred during the SARS-CoV-2 public health emergency.  Safety protocols were in place, including screening questions prior to the visit, additional usage of staff PPE, and extensive cleaning of exam room while observing appropriate contact time as indicated for disinfecting solutions.    Webb Silversmith, NP This visit occurred during the SARS-CoV-2 public health emergency.  Safety protocols were in place, including screening questions prior to the visit, additional usage of staff PPE, and extensive cleaning of exam room while observing appropriate contact time as indicated for disinfecting solutions.

## 2021-06-02 NOTE — Patient Instructions (Signed)
Health Maintenance, Female Adopting a healthy lifestyle and getting preventive care are important in promoting health and wellness. Ask your health care provider about: The right schedule for you to have regular tests and exams. Things you can do on your own to prevent diseases and keep yourself healthy. What should I know about diet, weight, and exercise? Eat a healthy diet  Eat a diet that includes plenty of vegetables, fruits, low-fat dairy products, and lean protein. Do not eat a lot of foods that are high in solid fats, added sugars, or sodium. Maintain a healthy weight Body mass index (BMI) is used to identify weight problems. It estimates body fat based on height and weight. Your health care provider can help determine your BMI and help you achieve or maintain a healthy weight. Get regular exercise Get regular exercise. This is one of the most important things you can do for your health. Most adults should: Exercise for at least 150 minutes each week. The exercise should increase your heart rate and make you sweat (moderate-intensity exercise). Do strengthening exercises at least twice a week. This is in addition to the moderate-intensity exercise. Spend less time sitting. Even light physical activity can be beneficial. Watch cholesterol and blood lipids Have your blood tested for lipids and cholesterol at 35 years of age, then have this test every 5 years. Have your cholesterol levels checked more often if: Your lipid or cholesterol levels are high. You are older than 35 years of age. You are at high risk for heart disease. What should I know about cancer screening? Depending on your health history and family history, you may need to have cancer screening at various ages. This may include screening for: Breast cancer. Cervical cancer. Colorectal cancer. Skin cancer. Lung cancer. What should I know about heart disease, diabetes, and high blood pressure? Blood pressure and heart  disease High blood pressure causes heart disease and increases the risk of stroke. This is more likely to develop in people who have high blood pressure readings, are of African descent, or are overweight. Have your blood pressure checked: Every 3-5 years if you are 18-39 years of age. Every year if you are 40 years old or older. Diabetes Have regular diabetes screenings. This checks your fasting blood sugar level. Have the screening done: Once every three years after age 40 if you are at a normal weight and have a low risk for diabetes. More often and at a younger age if you are overweight or have a high risk for diabetes. What should I know about preventing infection? Hepatitis B If you have a higher risk for hepatitis B, you should be screened for this virus. Talk with your health care provider to find out if you are at risk for hepatitis B infection. Hepatitis C Testing is recommended for: Everyone born from 1945 through 1965. Anyone with known risk factors for hepatitis C. Sexually transmitted infections (STIs) Get screened for STIs, including gonorrhea and chlamydia, if: You are sexually active and are younger than 35 years of age. You are older than 35 years of age and your health care provider tells you that you are at risk for this type of infection. Your sexual activity has changed since you were last screened, and you are at increased risk for chlamydia or gonorrhea. Ask your health care provider if you are at risk. Ask your health care provider about whether you are at high risk for HIV. Your health care provider may recommend a prescription medicine   to help prevent HIV infection. If you choose to take medicine to prevent HIV, you should first get tested for HIV. You should then be tested every 3 months for as long as you are taking the medicine. Pregnancy If you are about to stop having your period (premenopausal) and you may become pregnant, seek counseling before you get  pregnant. Take 400 to 800 micrograms (mcg) of folic acid every day if you become pregnant. Ask for birth control (contraception) if you want to prevent pregnancy. Osteoporosis and menopause Osteoporosis is a disease in which the bones lose minerals and strength with aging. This can result in bone fractures. If you are 65 years old or older, or if you are at risk for osteoporosis and fractures, ask your health care provider if you should: Be screened for bone loss. Take a calcium or vitamin D supplement to lower your risk of fractures. Be given hormone replacement therapy (HRT) to treat symptoms of menopause. Follow these instructions at home: Lifestyle Do not use any products that contain nicotine or tobacco, such as cigarettes, e-cigarettes, and chewing tobacco. If you need help quitting, ask your health care provider. Do not use street drugs. Do not share needles. Ask your health care provider for help if you need support or information about quitting drugs. Alcohol use Do not drink alcohol if: Your health care provider tells you not to drink. You are pregnant, may be pregnant, or are planning to become pregnant. If you drink alcohol: Limit how much you use to 0-1 drink a day. Limit intake if you are breastfeeding. Be aware of how much alcohol is in your drink. In the [place] one drink equals one 12 oz bottle of beer (355 mL), one 5 oz glass of wine (148 mL), or one 1 oz glass of hard liquor (44 mL). General instructions Schedule regular health, dental, and eye exams. Stay current with your vaccines. Tell your health care provider if: You often feel depressed. You have ever been abused or do not feel safe at home. Summary Adopting a healthy lifestyle and getting preventive care are important in promoting health and wellness. Follow your health care provider's instructions about healthy diet, exercising, and getting tested or screened for diseases. Follow your health care provider's  instructions on monitoring your cholesterol and blood pressure. This information is not intended to replace advice given to you by your health care provider. Make sure you discuss any questions you have with your health care provider. Document Revised: 10/09/2020 Document Reviewed: 07/25/2018 Elsevier Patient Education  2022 Elsevier Inc.  

## 2021-06-02 NOTE — Assessment & Plan Note (Signed)
Improved but persistent Will increase Sertraline to 100 mg daily Rx for Hydroxyzine 10 mg daily as needed for increased anxiety-sedation caution given Will fill out FMLA forms for 4 hours weekly per month for the next 6 months. Support offered

## 2021-06-02 NOTE — Assessment & Plan Note (Signed)
Encourage diet and exercise for weight loss 

## 2021-06-03 ENCOUNTER — Encounter: Payer: Managed Care, Other (non HMO) | Admitting: Internal Medicine

## 2021-06-04 LAB — SPECIMEN STATUS REPORT

## 2021-06-04 LAB — NUSWAB BV AND CANDIDA, NAA
Candida albicans, NAA: NEGATIVE
Candida glabrata, NAA: NEGATIVE

## 2021-06-05 LAB — CMP14+EGFR
ALT: 31 IU/L (ref 0–32)
AST: 29 IU/L (ref 0–40)
Albumin/Globulin Ratio: 1.5 (ref 1.2–2.2)
Albumin: 4.4 g/dL (ref 3.8–4.8)
Alkaline Phosphatase: 64 IU/L (ref 44–121)
BUN/Creatinine Ratio: 12 (ref 9–23)
BUN: 11 mg/dL (ref 6–20)
Bilirubin Total: 0.3 mg/dL (ref 0.0–1.2)
CO2: 24 mmol/L (ref 20–29)
Calcium: 9.4 mg/dL (ref 8.7–10.2)
Chloride: 102 mmol/L (ref 96–106)
Creatinine, Ser: 0.94 mg/dL (ref 0.57–1.00)
Globulin, Total: 3 g/dL (ref 1.5–4.5)
Glucose: 92 mg/dL (ref 70–99)
Potassium: 4.5 mmol/L (ref 3.5–5.2)
Sodium: 139 mmol/L (ref 134–144)
Total Protein: 7.4 g/dL (ref 6.0–8.5)
eGFR: 81 mL/min/{1.73_m2} (ref >59)

## 2021-06-05 LAB — HEPATITIS C ANTIBODY: Hep C Virus Ab: <0.1 s/co ratio (ref 0.0–0.9)

## 2021-06-05 LAB — LIPID PANEL
Chol/HDL Ratio: 2.7 ratio (ref 0.0–4.4)
Cholesterol, Total: 165 mg/dL (ref 100–199)
HDL: 61 mg/dL (ref >39)
LDL Chol Calc (NIH): 94 mg/dL (ref 0–99)
Triglycerides: 50 mg/dL (ref 0–149)
VLDL Cholesterol Cal: 10 mg/dL (ref 5–40)

## 2021-06-05 LAB — HEMOGLOBIN A1C
Est. average glucose Bld gHb Est-mCnc: 105 mg/dL
Hgb A1c MFr Bld: 5.3 % (ref 4.8–5.6)

## 2021-06-05 LAB — TSH: TSH: 1.08 u[IU]/mL (ref 0.450–4.500)

## 2021-07-16 NOTE — Telephone Encounter (Signed)
Mirena rcvd/charged 10/28/2019

## 2021-12-17 ENCOUNTER — Ambulatory Visit: Payer: Managed Care, Other (non HMO) | Admitting: Internal Medicine

## 2021-12-17 ENCOUNTER — Encounter: Payer: Self-pay | Admitting: Internal Medicine

## 2021-12-17 DIAGNOSIS — F32A Anxiety disorder, unspecified: Secondary | ICD-10-CM

## 2021-12-17 DIAGNOSIS — F419 Anxiety disorder, unspecified: Secondary | ICD-10-CM

## 2021-12-17 NOTE — Assessment & Plan Note (Signed)
Deteriorated after stopping Sertraline, she will restart ?She is considering looking for a therapist ?Support offered ?

## 2021-12-17 NOTE — Assessment & Plan Note (Signed)
Discussed weight loss options including Contrave, Wegovy, Saxenda or Ozempic ?She thinks she would like to try a calorie restricted diet first ?We will monitor ?

## 2021-12-17 NOTE — Progress Notes (Signed)
? ?Subjective:  ? ? Patient ID: Margaret Potter, female    DOB: [birth year], 36 y.o.   MRN: [id number] ? ?HPI ? ?Patient presents to clinic today to follow up anxiety and depression. She does feel like think this is getting worse with Mother's day coming up. She stopped her Sertraline 1 month ago but she does feel like it was effective. She is not currently seeing a therapist. She denies SI/HI. ? ?She would also like to discuss her weight loss options.  Her weight today is 278 LBS with a BMI of 43.54. ? ? ?Review of Systems ? ?   ?Past Medical History:  ?Diagnosis Date  ? No known health problems   ? ? ?Current Outpatient Medications  ?Medication Sig Dispense Refill  ? Biotin 1 MG CAPS Take by mouth.    ? Cholecalciferol (VITAMIN D3) 125 MCG (5000 UT) CAPS Take by mouth.    ? fluticasone (FLONASE) 50 MCG/ACT nasal spray Place 2 sprays into both nostrils daily. 16 g 6  ? hydrOXYzine (ATARAX/VISTARIL) 10 MG tablet Take 1 tablet (10 mg total) by mouth daily as needed. 30 tablet 0  ? levonorgestrel (MIRENA) 20 MCG/24HR IUD 1 Intra Uterine Device (1 each total) by Intrauterine route once for 1 dose. 1 each 0  ? loratadine (CLARITIN) 10 MG tablet Take 10 mg by mouth daily as needed for allergies.    ? Multiple Vitamin (MULTIVITAMIN) tablet Take 1 tablet by mouth daily.    ? sertraline (ZOLOFT) 100 MG tablet Take 1 tablet (100 mg total) by mouth daily. 90 tablet 0  ? ?No current facility-administered medications for this visit.  ? ? ?Allergies  ?Allergen Reactions  ? Azithromycin Hives  ? ? ?Family History  ?Problem Relation Age of Onset  ? Arthritis Mother   ? Cancer Mother   ?     cervical  ? Depression Mother   ? Diabetes Mother   ? Heart disease Mother   ? Hyperlipidemia Mother   ? Hypertension Mother   ? Stroke Mother   ? Depression Father   ? Ulcers Father   ? Depression Brother   ? Arthritis Maternal Grandmother   ? Asthma Maternal Grandmother   ? Cancer Maternal Grandmother   ?     breast  ? Depression Maternal  Grandmother   ? Diabetes Maternal Grandmother   ? Heart disease Maternal Grandmother   ? Hypertension Maternal Grandmother   ? Stroke Maternal Grandmother   ? Vision loss Maternal Grandmother   ? Arthritis Maternal Grandfather   ? Alcohol abuse Maternal Grandfather   ? Cancer Maternal Grandfather   ? Hypertension Maternal Grandfather   ? Stroke Maternal Grandfather   ? Heart attack Maternal Grandfather   ? Arthritis Paternal Grandmother   ? Diabetes Paternal Grandmother   ? Hypertension Paternal Grandmother   ? Vision loss Paternal Grandmother   ? Ovarian cancer Neg Hx   ? Colon cancer Neg Hx   ? ? ?Social History  ? ?Socioeconomic History  ? Marital status: Single  ?  Spouse name: Not on file  ? Number of children: 3  ? Years of education: College  ? Highest education level: Not on file  ?Occupational History  ? Occupation: Paramedic  ?Tobacco Use  ? Smoking status: Former  ?  Packs/day: 0.10  ?  Years: 5.00  ?  Pack years: 0.50  ?  Types: Cigarettes  ?  Quit date: 09/17/2018  ?  Years since quitting: 3.2  ?  Smokeless tobacco: Never  ? Tobacco comments:  ?  Quit prior 2 years, without treatment. Pt states she's planning quit on 04/19/18  ?Vaping Use  ? Vaping Use: Never used  ?Substance and Sexual Activity  ? Alcohol use: No  ? Drug use: No  ? Sexual activity: Not Currently  ?  Birth control/protection: I.U.D.  ?Other Topics Concern  ? Not on file  ?Social History Narrative  ? Not on file  ? ?Social Determinants of Health  ? ?Financial Resource Strain: Not on file  ?Food Insecurity: Not on file  ?Transportation Needs: Not on file  ?Physical Activity: Not on file  ?Stress: Not on file  ?Social Connections: Not on file  ?Intimate Partner Violence: Not on file  ? ? ? ?Constitutional: Denies fever, malaise, fatigue, headache or abrupt weight changes.  ?Respiratory: Denies difficulty breathing, shortness of breath, cough or sputum production.   ?Cardiovascular: Denies chest pain, chest tightness, palpitations or  swelling in the hands or feet.  ?Neurological: Denies dizziness, difficulty with memory, difficulty with speech or problems with balance and coordination.  ?Psych: Patient has a history of anxiety and depression.  Denies SI/HI. ? ?No other specific complaints in a complete review of systems (except as listed in HPI above). ? ?Objective:  ? Physical Exam ? ?BP 114/64 (BP Location: Left Arm, Patient Position: Sitting, Cuff Size: Large)   Pulse 79   Temp (!) 97.5 ?F (36.4 ?C) (Temporal)   Ht 5\' 7"  (1.702 m)   Wt 278 lb (126.1 kg)   SpO2 97%   BMI 43.54 kg/m?  ? ?Wt Readings from Last 3 Encounters:  ?06/02/21 260 lb (117.9 kg)  ?03/03/21 242 lb 12.8 oz (110.1 kg)  ?01/13/21 229 lb 15 oz (104.3 kg)  ? ? ?General: Appears her stated age, obese, in NAD. ?Cardiovascular: Normal rate. ?Pulmonary/Chest: Normal effort. ?Musculoskeletal: No difficulty with gait.  ?Neurological: Alert and oriented. ?Psychiatric: Mood and affect normal. Behavior is normal. Judgment and thought content normal.  ? ? ?BMET ?   ?Component Value Date/Time  ? NA 139 06/04/2021 0824  ? K 4.5 06/04/2021 0824  ? CL 102 06/04/2021 0824  ? CO2 24 06/04/2021 0824  ? GLUCOSE 92 06/04/2021 0824  ? BUN 11 06/04/2021 0824  ? CREATININE 0.94 06/04/2021 0824  ? CALCIUM 9.4 06/04/2021 0824  ? GFRNONAA 94 04/05/2017 1142  ? GFRNONAA 99 04/14/2016 0000  ? GFRAA 109 04/05/2017 1142  ? GFRAA 114 04/14/2016 0000  ? ? ?Lipid Panel  ?   ?Component Value Date/Time  ? CHOL 165 06/04/2021 0824  ? CHOL 115 04/14/2016 0000  ? TRIG 50 06/04/2021 0824  ? TRIG 36 04/14/2016 0000  ? HDL 61 06/04/2021 0824  ? CHOLHDL 2.7 06/04/2021 0824  ? CHOLHDL 2.2 04/14/2016 0000  ? VLDL 7 04/14/2016 0000  ? LDLCALC 94 06/04/2021 0824  ? LDLCALC 56 04/14/2016 0000  ? ? ?CBC ?   ?Component Value Date/Time  ? WBC 4.9 04/05/2017 1142  ? WBC 7.0 04/26/2014 2052  ? RBC 4.53 04/05/2017 1142  ? RBC 4.04 04/26/2014 2052  ? HGB 12.8 04/05/2017 1142  ? HCT 38.9 04/05/2017 1142  ? PLT 316 04/05/2017  1142  ? MCV 86 04/05/2017 1142  ? MCV 75 (L) 04/26/2014 2052  ? MCH 28.3 04/05/2017 1142  ? MCH 24.1 (L) 04/26/2014 2052  ? MCHC 32.9 04/05/2017 1142  ? MCHC 32.1 04/26/2014 2052  ? RDW 13.5 04/05/2017 1142  ? RDW 14.3 04/26/2014 2052  ?  LYMPHSABS 1.9 04/05/2017 1142  ? LYMPHSABS 1.1 04/26/2014 2052  ? MONOABS 0.7 04/26/2014 2052  ? EOSABS 0.1 04/05/2017 1142  ? EOSABS 0.1 04/26/2014 2052  ? BASOSABS 0.0 04/05/2017 1142  ? BASOSABS 0.0 04/26/2014 2052  ? ? ?Hgb A1C ?Lab Results  ?Component Value Date  ? HGBA1C 5.3 06/04/2021  ? ? ? ? ? ?   ?Assessment & Plan:  ? ? ? ?Nicki Reaperegina Shalom Mcguiness, NP ? ?

## 2021-12-17 NOTE — Patient Instructions (Signed)

## 2021-12-22 ENCOUNTER — Ambulatory Visit: Payer: Managed Care, Other (non HMO) | Admitting: Internal Medicine

## 2022-01-27 ENCOUNTER — Encounter: Payer: Self-pay | Admitting: Internal Medicine

## 2022-01-27 ENCOUNTER — Ambulatory Visit: Payer: Self-pay

## 2022-01-27 NOTE — Telephone Encounter (Signed)
Advised pt Margaret Potter is booked tomorrow (01/27/2022)  I told her she could see Jacquelin Hawking, PA-C tomorrow.  Pt declined and would like to wait to see [facility].  She will try to call back tomorrow to see if Margaret Potter has any cancellations.   Thanks,  -Vernona Rieger

## 2022-01-27 NOTE — Telephone Encounter (Signed)
  Chief Complaint: depression Symptoms: depression worsened by events coming up like mom passing and different things that have triggered pt.  Frequency: today is bad day Pertinent Negatives: Patient denies SI or HI Disposition: [] ED /[] Urgent Care (no appt availability in office) / [x] Appointment(In office/virtual)/ []  [facility]/ [] Home Care/ [] Refused Recommended Disposition /[] [facility]/ []  Follow-up with PCP Additional Notes: pt upset and crying d/t date of mom passing 1 year ago coming up and pt having hard time dealing with it right now. She states she is still taking the zoloft, hasn't been taking the hydroxyzine lately so I did advise pt to take it as needed to help with symptoms. Pt wanted to see if she could get sooner appt but advised her had nothing available prior to appt on Monday. Offered virtual UC appt for today but pt refused d/t not wanting to have to explain situation to someone else and she had built a relationship with . Encouraged pt if she needed to just talk to someone again today or tomorrow to call office back and can talk to a nurse any time or could reach out to therapist and see if she could talk with her as well. Pt understood and thanked me for my time.   Reason for Disposition  [1] Depression AND [2] worsening (e.g.,sleeping poorly, less able to do activities of daily living)  Answer Assessment - Initial Assessment Questions 1. CONCERN: "What happened that made you call today?"     Mother passed away 1 year ago  2. DEPRESSION SYMPTOM SCREENING: "How are you feeling overall?" (e.g., decreased energy, increased sleeping or difficulty sleeping, difficulty concentrating, feelings of sadness, guilt, hopelessness, or worthlessness)     Depression, crying,  3. RISK OF HARM - SUICIDAL IDEATION:  "Do you ever have thoughts of hurting or killing yourself?"  (e.g., yes, no, no but preoccupation with thoughts about death)   - INTENT:  "Do  you have thoughts of hurting or killing yourself right NOW?" (e.g., yes, no, N/A)   - PLAN: "Do you have a specific plan for how you would do this?" (e.g., gun, knife, overdose, no plan, N/A)     no 4. RISK OF HARM - HOMICIDAL IDEATION:  "Do you ever have thoughts of hurting or killing someone else?"  (e.g., yes, no, no but preoccupation with thoughts about death)   - INTENT:  "Do you have thoughts of hurting or killing someone right NOW?" (e.g., yes, no, N/A)   - PLAN: "Do you have a specific plan for how you would do this?" (e.g., gun, knife, no plan, N/A)      No 5. FUNCTIONAL IMPAIRMENT: "How have things been going for you overall? Have you had more difficulty than usual doing your normal daily activities?"  (e.g., better, same, worse; self-care, school, work, interactions)     Worse unable to work right now  6. SUPPORT: "Who is with you now?" "Who do you live with?" "Do you have family or friends who you can talk to?"      Partner  7. THERAPIST: "Do you have a counselor or therapist? Name?"     yes 8. STRESSORS: "Has there been any new stress or recent changes in your life?"      9. ALCOHOL USE OR SUBSTANCE USE (DRUG USE): "Do you drink alcohol or use any illegal drugs?"     no  Protocols used: Depression-A-AH

## 2022-01-28 ENCOUNTER — Encounter: Payer: Self-pay | Admitting: Internal Medicine

## 2022-01-28 ENCOUNTER — Telehealth (INDEPENDENT_AMBULATORY_CARE_PROVIDER_SITE_OTHER): Payer: Managed Care, Other (non HMO) | Admitting: Internal Medicine

## 2022-01-28 DIAGNOSIS — F32A Depression, unspecified: Secondary | ICD-10-CM | POA: Diagnosis not present

## 2022-01-28 DIAGNOSIS — F419 Anxiety disorder, unspecified: Secondary | ICD-10-CM

## 2022-01-28 DIAGNOSIS — Z0289 Encounter for other administrative examinations: Secondary | ICD-10-CM | POA: Diagnosis not present

## 2022-01-28 NOTE — Patient Instructions (Signed)
Managing Loss, Adult People experience loss in many different ways throughout their lives. Events such as moving, changing jobs, and losing friends can create a sense of loss. The loss may be as serious as a major health change, divorce, death of a pet, or death of a loved one. All of these types of loss are likely to create a physical and emotional reaction known as grief. Grief is the result of a major change or an absence of something or someone that you count on. Grief is a normal reaction to loss. A variety of factors can affect your grieving experience, including: The nature of your loss. Your relationship to what or whom you lost. Your understanding of grief and how to manage it. Your support system. Be aware that when grief becomes extreme, it can lead to more severe issues like isolation, depression, anxiety, or suicidal thoughts. Talk with your health care provider if you have any of these issues. How to manage lifestyle changes Keep to your normal routine as much as possible. If you have trouble focusing or doing normal activities, it is acceptable to take some time away from your normal routine. Spend time with friends and loved ones. Eat a healthy diet, get plenty of sleep, and rest when you feel tired. How to recognize changes  The way that you deal with your grief will affect your ability to function as you normally do. When grieving, you may experience these changes: Numbness, shock, sadness, anxiety, anger, denial, and guilt. Thoughts about death. Unexpected crying. A physical sensation of emptiness in your stomach. Problems sleeping and eating. Tiredness (fatigue). Loss of interest in normal activities. Dreaming about or imagining seeing the person who died. A need to remember what or whom you lost. Difficulty thinking about anything other than your loss for a period of time. Relief. If you have been expecting the loss for a while, you may feel a sense of relief when it  happens. Follow these instructions at home: Activity Express your feelings in healthy ways, such as: Talking with others about your loss. It may be helpful to find others who have had a similar loss, such as a support group. Writing down your feelings in a journal. Doing physical activities to release stress and emotional energy. Doing creative activities like painting, sculpting, or playing or listening to music. Practicing resilience. This is the ability to recover and adjust after facing challenges. Reading some resources that encourage resilience may help you to learn ways to practice those behaviors.  General instructions Be patient with yourself and others. Allow the grieving process to happen, and remember that grieving takes time. It is likely that you may never feel completely done with some grief. You may find a way to move on while still cherishing memories and feelings about your loss. Accepting your loss is a process. It can take months or longer to adjust. Keep all follow-up visits. This is important. Where to find support To get support for managing loss: Ask your health care provider for help and recommendations, such as grief counseling or therapy. Think about joining a support group for people who are managing a loss. Where to find more information You can find more information about managing loss from: American Society of Clinical Oncology: www.cancer.net American Psychological Association: www.apa.org Contact a health care provider if: Your grief is extreme and keeps getting worse. You have ongoing grief that does not improve. Your body shows symptoms of grief, such as illness. You feel depressed, anxious, or   hopeless. Get help right away if: You have thoughts about hurting yourself or others. Get help right away if you feel like you may hurt yourself or others, or have thoughts about taking your own life. Go to your nearest emergency room or: Call 911. Call the  National Suicide Prevention Lifeline at 1-800-273-8255 or 988. This is open 24 hours a day. Text the Crisis Text Line at 741741. Summary Grief is the result of a major change or an absence of someone or something that you count on. Grief is a normal reaction to loss. The depth of grief and the period of recovery depend on the type of loss and your ability to adjust to the change and process your feelings. Processing grief requires patience and a willingness to accept your feelings and talk about your loss with people who are supportive. It is important to find resources that work for you and to realize that people experience grief differently. There is not one grieving process that works for everyone in the same way. Be aware that when grief becomes extreme, it can lead to more severe issues like isolation, depression, anxiety, or suicidal thoughts. Talk with your health care provider if you have any of these issues. This information is not intended to replace advice given to you by your health care provider. Make sure you discuss any questions you have with your health care provider. Document Revised: 03/22/2021 Document Reviewed: 03/22/2021 Elsevier Patient Education  2023 Elsevier Inc.  

## 2022-01-28 NOTE — Progress Notes (Signed)
Subjective:    Patient ID: Margaret Potter, female    DOB: 05-05-1986, 36 y.o.   MRN: [id number]   Virtual Visit via Video Note  I connected with Margaret Potter on 01/28/22 at  2:00 PM EDT by a video enabled telemedicine application and verified that I am speaking with the correct person using two identifiers.  Location: Patient: In her car Provider: Office  Person's participating in this video call: Nicki Reaper, NP and Rubena Hejl   I discussed the limitations of evaluation and management by telemedicine and the availability of in person appointments. The patient expressed understanding and agreed to proceed.  History of Present Illness:  Pt requesting FMLA form completions. This is for anxiety and depression related to grief due the loss of her mother. This occurred 1 year ago.  Her mother's death state is approaching.  She is having a hard time controlling her emotions and even function at times.  She is taking Sertraline and Hydroxyzine at as prescribed.  She has been seeing a grief counselor as well.  Past Medical History:  Diagnosis Date   No known health problems     Current Outpatient Medications  Medication Sig Dispense Refill   Biotin 1 MG CAPS Take by mouth.     Cholecalciferol (VITAMIN D3) 125 MCG (5000 UT) CAPS Take by mouth.     fluticasone (FLONASE) 50 MCG/ACT nasal spray Place 2 sprays into both nostrils daily. 16 g 6   hydrOXYzine (ATARAX/VISTARIL) 10 MG tablet Take 1 tablet (10 mg total) by mouth daily as needed. 30 tablet 0   levonorgestrel (MIRENA) 20 MCG/24HR IUD 1 Intra Uterine Device (1 each total) by Intrauterine route once for 1 dose. 1 each 0   loratadine (CLARITIN) 10 MG tablet Take 10 mg by mouth daily as needed for allergies.     Multiple Vitamin (MULTIVITAMIN) tablet Take 1 tablet by mouth daily.     sertraline (ZOLOFT) 100 MG tablet Take 1 tablet (100 mg total) by mouth daily. (Patient not taking: Reported on 12/17/2021) 90 tablet 0    No current facility-administered medications for this visit.    Allergies  Allergen Reactions   Azithromycin Hives    Family History  Problem Relation Age of Onset   Arthritis Mother    Cancer Mother        cervical   Depression Mother    Diabetes Mother    Heart disease Mother    Hyperlipidemia Mother    Hypertension Mother    Stroke Mother    Depression Father    Ulcers Father    Depression Brother    Arthritis Maternal Grandmother    Asthma Maternal Grandmother    Cancer Maternal Grandmother        breast   Depression Maternal Grandmother    Diabetes Maternal Grandmother    Heart disease Maternal Grandmother    Hypertension Maternal Grandmother    Stroke Maternal Grandmother    Vision loss Maternal Grandmother    Arthritis Maternal Grandfather    Alcohol abuse Maternal Grandfather    Cancer Maternal Grandfather    Hypertension Maternal Grandfather    Stroke Maternal Grandfather    Heart attack Maternal Grandfather    Arthritis Paternal Grandmother    Diabetes Paternal Grandmother    Hypertension Paternal Grandmother    Vision loss Paternal Grandmother    Ovarian cancer Neg Hx    Colon cancer Neg Hx     Social History   Socioeconomic History  Marital status: Single    Spouse name: Not on file   Number of children: 3   Years of education: College   Highest education level: Not on file  Occupational History   Occupation: [employer] Employee  Tobacco Use   Smoking status: Former    Packs/day: 0.10    Years: 5.00    Total pack years: 0.50    Types: Cigarettes    Quit date: 09/17/2018    Years since quitting: 3.3   Smokeless tobacco: Never   Tobacco comments:    Quit prior 2 years, without treatment. Pt states she's planning quit on 04/19/18  Vaping Use   Vaping Use: Never used  Substance and Sexual Activity   Alcohol use: No   Drug use: No   Sexual activity: Not Currently    Birth control/protection: I.U.D.  Other Topics Concern   Not on file   Social History Narrative   Not on file   Social Determinants of Health   Financial Resource Strain: Not on file  Food Insecurity: Not on file  Transportation Needs: Not on file  Physical Activity: Not on file  Stress: Not on file  Social Connections: Not on file  Intimate Partner Violence: Not on file     Constitutional: Denies fever, malaise, fatigue, headache or abrupt weight changes.  Respiratory: Denies difficulty breathing, shortness of breath, cough or sputum production.   Cardiovascular: Denies chest pain, chest tightness, palpitations or swelling in the hands or feet.  Neurological: Denies dizziness, difficulty with memory, difficulty with speech or problems with balance and coordination.  Psych: Patient has a history of anxiety and depression.  Denies SI/HI.  No other specific complaints in a complete review of systems (except as listed in HPI above).     Observations/Objective:    Wt Readings from Last 3 Encounters:  12/17/21 278 lb (126.1 kg)  06/02/21 260 lb (117.9 kg)  03/03/21 242 lb 12.8 oz (110.1 kg)    General: Appears her stated age, overweight, in NAD. Pulmonary/Chest: Normal effort. No respiratory distress.  Neurological: Alert and oriented.  Psychiatric: Tearful.  Judgment and thought content normal.    BMET    Component Value Date/Time   NA 139 06/04/2021 0824   K 4.5 06/04/2021 0824   CL 102 06/04/2021 0824   CO2 24 06/04/2021 0824   GLUCOSE 92 06/04/2021 0824   BUN 11 06/04/2021 0824   CREATININE 0.94 06/04/2021 0824   CALCIUM 9.4 06/04/2021 0824   GFRNONAA 94 04/05/2017 1142   GFRNONAA 99 04/14/2016 0000   GFRAA 109 04/05/2017 1142   GFRAA 114 04/14/2016 0000    Lipid Panel     Component Value Date/Time   CHOL 165 06/04/2021 0824   CHOL 115 04/14/2016 0000   TRIG 50 06/04/2021 0824   TRIG 36 04/14/2016 0000   HDL 61 06/04/2021 0824   CHOLHDL 2.7 06/04/2021 0824   CHOLHDL 2.2 04/14/2016 0000   VLDL 7 04/14/2016 0000    LDLCALC 94 06/04/2021 0824   LDLCALC 56 04/14/2016 0000    CBC    Component Value Date/Time   WBC 4.9 04/05/2017 1142   WBC 7.0 04/26/2014 2052   RBC 4.53 04/05/2017 1142   RBC 4.04 04/26/2014 2052   HGB 12.8 04/05/2017 1142   HCT 38.9 04/05/2017 1142   PLT 316 04/05/2017 1142   MCV 86 04/05/2017 1142   MCV 75 (L) 04/26/2014 2052   MCH 28.3 04/05/2017 1142   MCH 24.1 (L) 04/26/2014 2052  MCHC 32.9 04/05/2017 1142   MCHC 32.1 04/26/2014 2052   RDW 13.5 04/05/2017 1142   RDW 14.3 04/26/2014 2052   LYMPHSABS 1.9 04/05/2017 1142   LYMPHSABS 1.1 04/26/2014 2052   MONOABS 0.7 04/26/2014 2052   EOSABS 0.1 04/05/2017 1142   EOSABS 0.1 04/26/2014 2052   BASOSABS 0.0 04/05/2017 1142   BASOSABS 0.0 04/26/2014 2052    Hgb A1C Lab Results  Component Value Date   HGBA1C 5.3 06/04/2021        Assessment and Plan:  Anxiety and Depression, Encounter for Form Completion with Patient:  She will continue sertraline and hydroxyzine as prescribed Encouraged her to continue to meet with grief counselor FMLA forms completed, copy scanned to chart and original faxed  Update me in 3 months and let me know how you are doing  Follow Up Instructions:    I discussed the assessment and treatment plan with the patient. The patient was provided an opportunity to ask questions and all were answered. The patient agreed with the plan and demonstrated an understanding of the instructions.   The patient was advised to call back or seek an in-person evaluation if the symptoms worsen or if the condition fails to improve as anticipated.    Nicki Reaper, NP

## 2022-01-31 ENCOUNTER — Telehealth: Payer: Managed Care, Other (non HMO) | Admitting: Internal Medicine

## 2022-07-12 ENCOUNTER — Encounter: Payer: Self-pay | Admitting: Family Medicine

## 2022-07-12 ENCOUNTER — Ambulatory Visit: Payer: Managed Care, Other (non HMO) | Admitting: Family Medicine

## 2022-07-12 VITALS — BP 127/52 | HR 80 | Ht 67.0 in | Wt 261.0 lb

## 2022-07-12 DIAGNOSIS — R490 Dysphonia: Secondary | ICD-10-CM

## 2022-07-12 DIAGNOSIS — J0110 Acute frontal sinusitis, unspecified: Secondary | ICD-10-CM | POA: Diagnosis not present

## 2022-07-12 DIAGNOSIS — J040 Acute laryngitis: Secondary | ICD-10-CM | POA: Diagnosis not present

## 2022-07-12 DIAGNOSIS — J069 Acute upper respiratory infection, unspecified: Secondary | ICD-10-CM

## 2022-07-12 LAB — POCT INFLUENZA A/B
Influenza A, POC: NEGATIVE
Influenza B, POC: NEGATIVE

## 2022-07-12 LAB — POC COVID19 BINAXNOW: SARS Coronavirus 2 Ag: NEGATIVE

## 2022-07-12 MED ORDER — PREDNISONE 20 MG PO TABS: ORAL_TABLET | ORAL | 0 refills | Status: DC

## 2022-07-12 MED ORDER — AMOXICILLIN-POT CLAVULANATE 875-125 MG PO TABS: 1.0000 | ORAL_TABLET | Freq: Two times a day (BID) | ORAL | 0 refills | Status: DC

## 2022-07-12 MED ORDER — IPRATROPIUM BROMIDE 0.06 % NA SOLN: 2.0000 | Freq: Four times a day (QID) | NASAL | 0 refills | Status: DC

## 2022-07-12 NOTE — Patient Instructions (Addendum)
Thank you for coming to the office today.  COVID and Flu testing today.  Continue the Claritin, Flonase, may add Sudafed OTC.  Ordered some back up plan medications - If laryngitis does not resolve - can take Prednisone taper 6 days - If signs of sinus infection or other infection fever chills productive cough thicker drainage, can take antibiotic - If congestion but no other concerns, can use the Atrovent Nasal spray as well  Laryngitis  Laryngitis is inflammation of the vocal cords that causes symptoms such as hoarseness or loss of voice. The vocal cords are two bands of muscles in your throat. When you speak, these cords come together and vibrate. The vibrations come out through your mouth as sound. When your vocal cords are inflamed, your voice sounds different. Laryngitis can be temporary (acute) or long-term (chronic). Most cases of acute laryngitis improve with time. Chronic laryngitis is laryngitis that lasts for more than 3 weeks. What are the causes? Acute laryngitis may be caused by: A viral infection. Lots of talking, yelling, or singing. This is also called vocal strain. A bacterial infection. Chronic laryngitis may be caused by: Vocal strain or an injury to the vocal cords. Acid reflux (gastroesophageal reflux disease, or GERD). Allergies, a sinus infection, or postnasal drip. Smoking. Excessive alcohol use. Breathing in chemicals or dust. Growths on the vocal cords. What increases the risk? The following factors may make you more likely to develop this condition: Smoking. Alcohol abuse. Having allergies. Chronic irritants in the workplace, such as toxic fumes. What are the signs or symptoms? Symptoms of this condition may include: Low, hoarse voice. Loss of voice. Dry cough. Sore or dry throat. Stuffy or congested nose. How is this diagnosed? This condition may be diagnosed based on: Your symptoms and a physical exam. Throat culture. Blood test. A  procedure in which your health care provider looks at your vocal cords with a mirror or viewing tube (laryngoscopy). How is this treated? Treatment for laryngitis depends on what is causing it. Usually, treatment involves resting your voice and using medicines to soothe your throat. If your laryngitis is caused by a bacterial infection, you may need to take antibiotic medicine. If your laryngitis is caused by a growth, you may need to have a procedure to remove it. Follow these instructions at home: Medicines Take over-the-counter and prescription medicines only as told by your health care provider. If you were prescribed an antibiotic medicine, take it as told by your health care provider. Do not stop taking the antibiotic even if you start to feel better. Use throat lozenges or sprays to soothe your throat as told by your health care provider. General instructions  Talk as little as possible. To do this: Write instead of talking. Do this until your voice is back to normal. Avoid whispering, which can cause vocal strain. Gargle with a mixture of salt and water 3-4 times a day or as needed. To make salt water, completely dissolve -1 tsp (3-6 g) of salt in 1 cup (237 mL) of warm water. Drink enough fluid to keep your urine pale yellow. Breathe in moist air. Use a humidifier if you live in a dry climate. Do not use any products that contain nicotine or tobacco. These products include cigarettes, chewing tobacco, and vaping devices, such as e-cigarettes. If you need help quitting, ask your health care provider. Contact a health care provider if: You have a fever. You have increasing pain. Your symptoms do not get better in 2  weeks. Get help right away if: You cough up blood. You have difficulty swallowing. You have trouble breathing. Summary Laryngitis is inflammation of the vocal cords that causes symptoms such as hoarseness or loss of voice. Laryngitis can be temporary or  long-term. Treatment for laryngitis depends on the cause. It often involves resting your voice and using medicine to soothe your throat. Get help right away if you have difficulty swallowing or breathing or if you cough up blood. This information is not intended to replace advice given to you by your health care provider. Make sure you discuss any questions you have with your health care provider. Document Revised: 10/19/2020 Document Reviewed: 10/19/2020 Elsevier Patient Education  [place].      Please schedule a Follow-up Appointment to: Return if symptoms worsen or fail to improve.  If you have any other questions or concerns, please feel free to call the office or send a message through [place]. You may also schedule an earlier appointment if necessary.  Additionally, you may be receiving a survey about your experience at our office within a few days to 1 week by e-mail or mail. We value your feedback.  Nobie Putnam, DO [place]

## 2022-07-12 NOTE — Progress Notes (Signed)
Subjective:    Patient ID: Margaret Potter, female    DOB: 1985-09-16, 36 y.o.   MRN: [id number]  Margaret Potter is a 36 y.o. female presenting on 07/12/2022 for Hoarse and Sore Throat  Patient presents for a same day appointment.  PCP Nicki Reaper, FNP  HPI  Laryngitis Hoarse Voice Sore Throat Reports onset 1-2 days now worsening sore throat hoarse voice, has had before with sinus drainage and allergies. Taking Flonase for sinuses, and Claritin daily For throat she takes hot tea, water, lemon honey No sick contacts She is traveling soon to visit a newborn infant      07/12/2022    2:20 PM 12/17/2021    2:57 PM 06/02/2021    9:39 AM  Depression screen PHQ 2/9  Decreased Interest 1 1 1   Down, Depressed, Hopeless 1 1 2   PHQ - 2 Score 2 2 3   Altered sleeping 0 0 0  Tired, decreased energy 0 0 0  Change in appetite 0 1 1  Feeling bad or failure about yourself  0 0 0  Trouble concentrating 1 0 1  Moving slowly or fidgety/restless 0 0 0  Suicidal thoughts 0 0 0  PHQ-9 Score 3 3 5   Difficult doing work/chores Somewhat difficult Not difficult at all Somewhat difficult    Social History   Tobacco Use   Smoking status: Former    Packs/day: 0.10    Years: 5.00    Total pack years: 0.50    Types: Cigarettes    Quit date: 09/17/2018    Years since quitting: 3.8   Smokeless tobacco: Never   Tobacco comments:    Quit prior 2 years, without treatment. Pt states she's planning quit on 04/19/18  Vaping Use   Vaping Use: Never used  Substance Use Topics   Alcohol use: No   Drug use: No    Review of Systems Per HPI unless specifically indicated above     Objective:    BP (!) 127/52   Pulse 80   Ht 5\' 7"  (1.702 m)   Wt 261 lb (118.4 kg)   SpO2 100%   BMI 40.88 kg/m   Wt Readings from Last 3 Encounters:  07/12/22 261 lb (118.4 kg)  12/17/21 278 lb (126.1 kg)  06/02/21 260 lb (117.9 kg)    Physical Exam Vitals and nursing note reviewed.  Constitutional:       General: She is not in acute distress.    Appearance: She is well-developed. She is not diaphoretic.     Comments: Well-appearing, comfortable, cooperative  HENT:     Head: Normocephalic and atraumatic.     Nose: No congestion.     Mouth/Throat:     Mouth: Mucous membranes are moist. No oral lesions.     Pharynx: Uvula midline. No pharyngeal swelling, oropharyngeal exudate, posterior oropharyngeal erythema or uvula swelling.  Eyes:     General:        Right eye: No discharge.        Left eye: No discharge.     Conjunctiva/sclera: Conjunctivae normal.  Neck:     Thyroid: No thyromegaly.  Cardiovascular:     Rate and Rhythm: Normal rate and regular rhythm.     Heart sounds: Normal heart sounds. No murmur heard. Pulmonary:     Effort: Pulmonary effort is normal. No respiratory distress.     Breath sounds: Normal breath sounds. No wheezing or rales.  Musculoskeletal:        General:  Normal range of motion.     Cervical back: Normal range of motion and neck supple.  Lymphadenopathy:     Cervical: No cervical adenopathy.  Skin:    General: Skin is warm and dry.     Findings: No erythema or rash.  Neurological:     Mental Status: She is alert and oriented to person, place, and time.  Psychiatric:        Behavior: Behavior normal.     Comments: Well groomed, good eye contact, normal speech and thoughts      Results for orders placed or performed in visit on 07/12/22  POCT Influenza A/B  Result Value Ref Range   Influenza A, POC Negative Negative   Influenza B, POC Negative Negative  POC COVID-19  Result Value Ref Range   SARS Coronavirus 2 Ag Negative Negative      Assessment & Plan:   Problem List Items Addressed This Visit   None Visit Diagnoses     Laryngitis    -  Primary   Relevant Medications   predniSONE (DELTASONE) 20 MG tablet   Other Relevant Orders   POCT Influenza A/B (Completed)   POC COVID-19 (Completed)   Hoarse       Relevant Medications    predniSONE (DELTASONE) 20 MG tablet   Acute non-recurrent frontal sinusitis       Relevant Medications   amoxicillin-clavulanate (AUGMENTIN) 875-125 MG tablet   ipratropium (ATROVENT) 0.06 % nasal spray   predniSONE (DELTASONE) 20 MG tablet   Viral URI       Relevant Orders   POCT Influenza A/B (Completed)   POC COVID-19 (Completed)       Consistent with acute pharyngitis w laryngitis hoarse voice Limited infectious symptoms except some sinus drainage allergy symptoms present. Afebrile No sick contacts reassurance No sign of strep on exam  COVID FLU POC test negative today  Likely post nasal drainage as main cause  Continue the Claritin, Flonase, may add Sudafed OTC.  Ordered some back up plan medications - If laryngitis does not resolve - can take Prednisone taper 6 days - If signs of sinus infection or other infection fever chills productive cough thicker drainage, can take antibiotic - If congestion but no other concerns, can use the Atrovent Nasal spray as well    Meds ordered this encounter  Medications   amoxicillin-clavulanate (AUGMENTIN) 875-125 MG tablet    Sig: Take 1 tablet by mouth 2 (two) times daily.    Dispense:  20 tablet    Refill:  0   ipratropium (ATROVENT) 0.06 % nasal spray    Sig: Place 2 sprays into both nostrils 4 (four) times daily. For up to 5-7 days then stop.    Dispense:  15 mL    Refill:  0   predniSONE (DELTASONE) 20 MG tablet    Sig: Take daily with food. Start with 40mg  (2 pills) x 2 days, then reduce to 20mg  (1 pill) x 2 days, then 10mg  (half pill) x 2 days    Dispense:  7 tablet    Refill:  0      Follow up plan: Return if symptoms worsen or fail to improve.   , DO [facility] 07/12/2022, 1:45 PM

## 2022-10-24 ENCOUNTER — Ambulatory Visit: Payer: Managed Care, Other (non HMO) | Admitting: Internal Medicine

## 2022-10-24 ENCOUNTER — Telehealth: Payer: Managed Care, Other (non HMO) | Admitting: Internal Medicine

## 2022-10-24 ENCOUNTER — Telehealth: Payer: Self-pay | Admitting: Internal Medicine

## 2022-10-24 NOTE — Progress Notes (Deleted)
Subjective:    Patient ID: Margaret Potter, female    DOB: February 22, 1986, 37 y.o.   MRN: [id number]  HPI  Patient presents to clinic today with complaint of headaches.  This started.  The headache are located.  She describes the pain as.  She reports associated.  She denies.  Review of Systems     Past Medical History:  Diagnosis Date   No known health problems     Current Outpatient Medications  Medication Sig Dispense Refill   amoxicillin-clavulanate (AUGMENTIN) 875-125 MG tablet Take 1 tablet by mouth 2 (two) times daily. 20 tablet 0   Biotin 1 MG CAPS Take by mouth.     Cholecalciferol (VITAMIN D3) 125 MCG (5000 UT) CAPS Take by mouth.     fluticasone (FLONASE) 50 MCG/ACT nasal spray Place 2 sprays into both nostrils daily. 16 g 6   hydrOXYzine (ATARAX/VISTARIL) 10 MG tablet Take 1 tablet (10 mg total) by mouth daily as needed. 30 tablet 0   ipratropium (ATROVENT) 0.06 % nasal spray Place 2 sprays into both nostrils 4 (four) times daily. For up to 5-7 days then stop. 15 mL 0   levonorgestrel (MIRENA) 20 MCG/24HR IUD 1 Intra Uterine Device (1 each total) by Intrauterine route once for 1 dose. 1 each 0   loratadine (CLARITIN) 10 MG tablet Take 10 mg by mouth daily as needed for allergies.     Multiple Vitamin (MULTIVITAMIN) tablet Take 1 tablet by mouth daily.     predniSONE (DELTASONE) 20 MG tablet Take daily with food. Start with '40mg'$  (2 pills) x 2 days, then reduce to '20mg'$  (1 pill) x 2 days, then '10mg'$  (half pill) x 2 days 7 tablet 0   sertraline (ZOLOFT) 100 MG tablet Take 1 tablet (100 mg total) by mouth daily. (Patient not taking: Reported on 07/12/2022) 90 tablet 0   No current facility-administered medications for this visit.    Allergies  Allergen Reactions   Azithromycin Hives    Family History  Problem Relation Age of Onset   Arthritis Mother    Cancer Mother        cervical   Depression Mother    Diabetes Mother    Heart disease Mother    Hyperlipidemia  Mother    Hypertension Mother    Stroke Mother    Depression Father    Ulcers Father    Depression Brother    Arthritis Maternal Grandmother    Asthma Maternal Grandmother    Cancer Maternal Grandmother        breast   Depression Maternal Grandmother    Diabetes Maternal Grandmother    Heart disease Maternal Grandmother    Hypertension Maternal Grandmother    Stroke Maternal Grandmother    Vision loss Maternal Grandmother    Arthritis Maternal Grandfather    Alcohol abuse Maternal Grandfather    Cancer Maternal Grandfather    Hypertension Maternal Grandfather    Stroke Maternal Grandfather    Heart attack Maternal Grandfather    Arthritis Paternal Grandmother    Diabetes Paternal Grandmother    Hypertension Paternal Grandmother    Vision loss Paternal Grandmother    Ovarian cancer Neg Hx    Colon cancer Neg Hx     Social History   Socioeconomic History   Marital status: Single    Spouse name: Not on file   Number of children: 3   Years of education: College   Highest education level: Not on file  Occupational History  Occupation: Armed forces logistics/support/administrative officer  Tobacco Use   Smoking status: Former    Packs/day: 0.10    Years: 5.00    Total pack years: 0.50    Types: Cigarettes    Quit date: 09/17/2018    Years since quitting: 4.1   Smokeless tobacco: Never   Tobacco comments:    Quit prior 2 years, without treatment. Pt states she's planning quit on 04/19/18  Vaping Use   Vaping Use: Never used  Substance and Sexual Activity   Alcohol use: No   Drug use: No   Sexual activity: Not Currently    Birth control/protection: I.U.D.  Other Topics Concern   Not on file  Social History Narrative   Not on file   Social Determinants of Health   Financial Resource Strain: Not on file  Food Insecurity: Not on file  Transportation Needs: Not on file  Physical Activity: Not on file  Stress: Not on file  Social Connections: Not on file  Intimate Partner Violence: Not on file      Constitutional: Patient reports headaches.  Denies fever, malaise, fatigue, or abrupt weight changes.  HEENT: Denies eye pain, eye redness, ear pain, ringing in the ears, wax buildup, runny nose, nasal congestion, bloody nose, or sore throat. Respiratory: Denies difficulty breathing, shortness of breath, cough or sputum production.   Cardiovascular: Denies chest pain, chest tightness, palpitations or swelling in the hands or feet.  Gastrointestinal: Denies abdominal pain, bloating, constipation, diarrhea or blood in the stool.  GU: Denies urgency, frequency, pain with urination, burning sensation, blood in urine, odor or discharge. Musculoskeletal: Denies decrease in range of motion, difficulty with gait, muscle pain or joint pain and swelling.  Skin: Denies redness, rashes, lesions or ulcercations.  Neurological: Denies dizziness, difficulty with memory, difficulty with speech or problems with balance and coordination.  Psych: Patient has a history of anxiety and depression.  Denies SI/HI.  No other specific complaints in a complete review of systems (except as listed in HPI above).  Objective:   Physical Exam  There were no vitals taken for this visit. Wt Readings from Last 3 Encounters:  07/12/22 261 lb (118.4 kg)  12/17/21 278 lb (126.1 kg)  06/02/21 260 lb (117.9 kg)    General: Appears their stated age, well developed, well nourished in NAD. Skin: Warm, dry and intact. No rashes, lesions or ulcerations noted. HEENT: Head: normal shape and size; Eyes: sclera white, no icterus, conjunctiva pink, PERRLA and EOMs intact; Ears: Tm's gray and intact, normal light reflex; Nose: mucosa pink and moist, septum midline; Throat/Mouth: Teeth present, mucosa pink and moist, no exudate, lesions or ulcerations noted.  Neck:  Neck supple, trachea midline. No masses, lumps or thyromegaly present.  Cardiovascular: Normal rate and rhythm. S1,S2 noted.  No murmur, rubs or gallops noted. No JVD or  BLE edema. No carotid bruits noted. Pulmonary/Chest: Normal effort and positive vesicular breath sounds. No respiratory distress. No wheezes, rales or ronchi noted.  Abdomen: Soft and nontender. Normal bowel sounds. No distention or masses noted. Liver, spleen and kidneys non palpable. Musculoskeletal: Normal range of motion. No signs of joint swelling. No difficulty with gait.  Neurological: Alert and oriented. Cranial nerves II-XII grossly intact. Coordination normal.  Psychiatric: Mood and affect normal. Behavior is normal. Judgment and thought content normal.     BMET    Component Value Date/Time   NA 139 06/04/2021 0824   K 4.5 06/04/2021 0824   CL 102 06/04/2021 0824   CO2 24  06/04/2021 0824   GLUCOSE 92 06/04/2021 0824   BUN 11 06/04/2021 0824   CREATININE 0.94 06/04/2021 0824   CALCIUM 9.4 06/04/2021 0824   GFRNONAA 94 04/05/2017 1142   GFRNONAA 99 04/14/2016 0000   GFRAA 109 04/05/2017 1142   GFRAA 114 04/14/2016 0000    Lipid Panel     Component Value Date/Time   CHOL 165 06/04/2021 0824   CHOL 115 04/14/2016 0000   TRIG 50 06/04/2021 0824   TRIG 36 04/14/2016 0000   HDL 61 06/04/2021 0824   CHOLHDL 2.7 06/04/2021 0824   CHOLHDL 2.2 04/14/2016 0000   VLDL 7 04/14/2016 0000   LDLCALC 94 06/04/2021 0824   LDLCALC 56 04/14/2016 0000    CBC    Component Value Date/Time   WBC 4.9 04/05/2017 1142   WBC 7.0 04/26/2014 2052   RBC 4.53 04/05/2017 1142   RBC 4.04 04/26/2014 2052   HGB 12.8 04/05/2017 1142   HCT 38.9 04/05/2017 1142   PLT 316 04/05/2017 1142   MCV 86 04/05/2017 1142   MCV 75 (L) 04/26/2014 2052   MCH 28.3 04/05/2017 1142   MCH 24.1 (L) 04/26/2014 2052   MCHC 32.9 04/05/2017 1142   MCHC 32.1 04/26/2014 2052   RDW 13.5 04/05/2017 1142   RDW 14.3 04/26/2014 2052   LYMPHSABS 1.9 04/05/2017 1142   LYMPHSABS 1.1 04/26/2014 2052   MONOABS 0.7 04/26/2014 2052   EOSABS 0.1 04/05/2017 1142   EOSABS 0.1 04/26/2014 2052   BASOSABS 0.0 04/05/2017  1142   BASOSABS 0.0 04/26/2014 2052    Hgb A1C Lab Results  Component Value Date   HGBA1C 5.3 06/04/2021           Assessment & Plan:     Schedule an appointment for your annual exam Webb Silversmith, NP

## 2022-10-24 NOTE — Telephone Encounter (Signed)
Pt is calling to ask why received a no show

## 2022-10-24 NOTE — Progress Notes (Deleted)
Subjective:    Patient ID: NEKEYA DAME, female    DOB: 1985/10/25, 37 y.o.   MRN: [id number]  HPI  Patient presents to clinic today with complaint of headaches.  This started.  She describes the pain as.  She reports associated.  She denies.  Review of Systems     Past Medical History:  Diagnosis Date   No known health problems     Current Outpatient Medications  Medication Sig Dispense Refill   amoxicillin-clavulanate (AUGMENTIN) 875-125 MG tablet Take 1 tablet by mouth 2 (two) times daily. 20 tablet 0   Biotin 1 MG CAPS Take by mouth.     Cholecalciferol (VITAMIN D3) 125 MCG (5000 UT) CAPS Take by mouth.     fluticasone (FLONASE) 50 MCG/ACT nasal spray Place 2 sprays into both nostrils daily. 16 g 6   hydrOXYzine (ATARAX/VISTARIL) 10 MG tablet Take 1 tablet (10 mg total) by mouth daily as needed. 30 tablet 0   ipratropium (ATROVENT) 0.06 % nasal spray Place 2 sprays into both nostrils 4 (four) times daily. For up to 5-7 days then stop. 15 mL 0   levonorgestrel (MIRENA) 20 MCG/24HR IUD 1 Intra Uterine Device (1 each total) by Intrauterine route once for 1 dose. 1 each 0   loratadine (CLARITIN) 10 MG tablet Take 10 mg by mouth daily as needed for allergies.     Multiple Vitamin (MULTIVITAMIN) tablet Take 1 tablet by mouth daily.     predniSONE (DELTASONE) 20 MG tablet Take daily with food. Start with '40mg'$  (2 pills) x 2 days, then reduce to '20mg'$  (1 pill) x 2 days, then '10mg'$  (half pill) x 2 days 7 tablet 0   sertraline (ZOLOFT) 100 MG tablet Take 1 tablet (100 mg total) by mouth daily. (Patient not taking: Reported on 07/12/2022) 90 tablet 0   No current facility-administered medications for this visit.    Allergies  Allergen Reactions   Azithromycin Hives    Family History  Problem Relation Age of Onset   Arthritis Mother    Cancer Mother        cervical   Depression Mother    Diabetes Mother    Heart disease Mother    Hyperlipidemia Mother    Hypertension  Mother    Stroke Mother    Depression Father    Ulcers Father    Depression Brother    Arthritis Maternal Grandmother    Asthma Maternal Grandmother    Cancer Maternal Grandmother        breast   Depression Maternal Grandmother    Diabetes Maternal Grandmother    Heart disease Maternal Grandmother    Hypertension Maternal Grandmother    Stroke Maternal Grandmother    Vision loss Maternal Grandmother    Arthritis Maternal Grandfather    Alcohol abuse Maternal Grandfather    Cancer Maternal Grandfather    Hypertension Maternal Grandfather    Stroke Maternal Grandfather    Heart attack Maternal Grandfather    Arthritis Paternal Grandmother    Diabetes Paternal Grandmother    Hypertension Paternal Grandmother    Vision loss Paternal Grandmother    Ovarian cancer Neg Hx    Colon cancer Neg Hx     Social History   Socioeconomic History   Marital status: Single    Spouse name: Not on file   Number of children: 3   Years of education: College   Highest education level: Not on file  Occupational History   Occupation: Armed forces logistics/support/administrative officer  Tobacco Use   Smoking status: Former    Packs/day: 0.10    Years: 5.00    Total pack years: 0.50    Types: Cigarettes    Quit date: 09/17/2018    Years since quitting: 4.1   Smokeless tobacco: Never   Tobacco comments:    Quit prior 2 years, without treatment. Pt states she's planning quit on 04/19/18  Vaping Use   Vaping Use: Never used  Substance and Sexual Activity   Alcohol use: No   Drug use: No   Sexual activity: Not Currently    Birth control/protection: I.U.D.  Other Topics Concern   Not on file  Social History Narrative   Not on file   Social Determinants of Health   Financial Resource Strain: Not on file  Food Insecurity: Not on file  Transportation Needs: Not on file  Physical Activity: Not on file  Stress: Not on file  Social Connections: Not on file  Intimate Partner Violence: Not on file     Constitutional:  Patient reports headaches.  Denies fever, malaise, fatigue, or abrupt weight changes.  HEENT: Denies eye pain, eye redness, ear pain, ringing in the ears, wax buildup, runny nose, nasal congestion, bloody nose, or sore throat. Respiratory: Denies difficulty breathing, shortness of breath, cough or sputum production.   Cardiovascular: Denies chest pain, chest tightness, palpitations or swelling in the hands or feet.  Gastrointestinal: Denies abdominal pain, bloating, constipation, diarrhea or blood in the stool.  GU: Denies urgency, frequency, pain with urination, burning sensation, blood in urine, odor or discharge. Musculoskeletal: Denies decrease in range of motion, difficulty with gait, muscle pain or joint pain and swelling.  Skin: Denies redness, rashes, lesions or ulcercations.  Neurological: Denies dizziness, difficulty with memory, difficulty with speech or problems with balance and coordination.  Psych: Denies anxiety, depression, SI/HI.  No other specific complaints in a complete review of systems (except as listed in HPI above).  Objective:   Physical Exam   There were no vitals taken for this visit. Wt Readings from Last 3 Encounters:  07/12/22 261 lb (118.4 kg)  12/17/21 278 lb (126.1 kg)  06/02/21 260 lb (117.9 kg)    General: Appears their stated age, well developed, well nourished in NAD. Skin: Warm, dry and intact. No rashes, lesions or ulcerations noted. HEENT: Head: normal shape and size; Eyes: sclera white, no icterus, conjunctiva pink, PERRLA and EOMs intact; Ears: Tm's gray and intact, normal light reflex; Nose: mucosa pink and moist, septum midline; Throat/Mouth: Teeth present, mucosa pink and moist, no exudate, lesions or ulcerations noted.  Neck:  Neck supple, trachea midline. No masses, lumps or thyromegaly present.  Cardiovascular: Normal rate and rhythm. S1,S2 noted.  No murmur, rubs or gallops noted. No JVD or BLE edema. No carotid bruits  noted. Pulmonary/Chest: Normal effort and positive vesicular breath sounds. No respiratory distress. No wheezes, rales or ronchi noted.  Abdomen: Soft and nontender. Normal bowel sounds. No distention or masses noted. Liver, spleen and kidneys non palpable. Musculoskeletal: Normal range of motion. No signs of joint swelling. No difficulty with gait.  Neurological: Alert and oriented. Cranial nerves II-XII grossly intact. Coordination normal.  Psychiatric: Mood and affect normal. Behavior is normal. Judgment and thought content normal.   BMET    Component Value Date/Time   NA 139 06/04/2021 0824   K 4.5 06/04/2021 0824   CL 102 06/04/2021 0824   CO2 24 06/04/2021 0824   GLUCOSE 92 06/04/2021 0824   BUN 11  06/04/2021 0824   CREATININE 0.94 06/04/2021 0824   CALCIUM 9.4 06/04/2021 0824   GFRNONAA 94 04/05/2017 1142   GFRNONAA 99 04/14/2016 0000   GFRAA 109 04/05/2017 1142   GFRAA 114 04/14/2016 0000    Lipid Panel     Component Value Date/Time   CHOL 165 06/04/2021 0824   CHOL 115 04/14/2016 0000   TRIG 50 06/04/2021 0824   TRIG 36 04/14/2016 0000   HDL 61 06/04/2021 0824   CHOLHDL 2.7 06/04/2021 0824   CHOLHDL 2.2 04/14/2016 0000   VLDL 7 04/14/2016 0000   LDLCALC 94 06/04/2021 0824   LDLCALC 56 04/14/2016 0000    CBC    Component Value Date/Time   WBC 4.9 04/05/2017 1142   WBC 7.0 04/26/2014 2052   RBC 4.53 04/05/2017 1142   RBC 4.04 04/26/2014 2052   HGB 12.8 04/05/2017 1142   HCT 38.9 04/05/2017 1142   PLT 316 04/05/2017 1142   MCV 86 04/05/2017 1142   MCV 75 (L) 04/26/2014 2052   MCH 28.3 04/05/2017 1142   MCH 24.1 (L) 04/26/2014 2052   MCHC 32.9 04/05/2017 1142   MCHC 32.1 04/26/2014 2052   RDW 13.5 04/05/2017 1142   RDW 14.3 04/26/2014 2052   LYMPHSABS 1.9 04/05/2017 1142   LYMPHSABS 1.1 04/26/2014 2052   MONOABS 0.7 04/26/2014 2052   EOSABS 0.1 04/05/2017 1142   EOSABS 0.1 04/26/2014 2052   BASOSABS 0.0 04/05/2017 1142   BASOSABS 0.0 04/26/2014 2052     Hgb A1C Lab Results  Component Value Date   HGBA1C 5.3 06/04/2021           Assessment & Plan:    Schedule an appointment for your annual exam Webb Silversmith, NP

## 2022-10-25 ENCOUNTER — Encounter: Payer: Self-pay | Admitting: Internal Medicine

## 2022-10-25 ENCOUNTER — Telehealth (INDEPENDENT_AMBULATORY_CARE_PROVIDER_SITE_OTHER): Payer: Managed Care, Other (non HMO) | Admitting: Internal Medicine

## 2022-10-25 DIAGNOSIS — R519 Headache, unspecified: Secondary | ICD-10-CM

## 2022-10-25 MED ORDER — BUTALBITAL-APAP-CAFFEINE 50-325-40 MG PO TABS: 1.0000 | ORAL_TABLET | Freq: Four times a day (QID) | ORAL | 2 refills | Status: DC | PRN

## 2022-10-25 NOTE — Patient Instructions (Signed)

## 2022-10-25 NOTE — Progress Notes (Signed)
Virtual Visit via Video Note  I connected with Margaret Potter on 10/25/22 at 11:20 AM EDT by a video enabled telemedicine application and verified that I am speaking with the correct person using two identifiers.  Location: Patient: Home Provider: Office  Persons participating in this video call: Webb Silversmith, NP and Antoinetta Potier   I discussed the limitations of evaluation and management by telemedicine and the availability of in person appointments. The patient expressed understanding and agreed to proceed.  History of Present Illness:  Patient reports headaches.  These started a few years ago but has worsened in the last few months. The headaches occur at least once per month. They are triggered by smell, strobing lights and allergy/mold.  The headaches are located in the right front side of her head. She describes the pain as sharp and stabbing but can be pounding and sensitive to touch at times.  She reports associated sensitivity to light, sensitivity to sound, dizziness, vision changes, nausea and vomiting.  She reports laying in a dark room  also helps. She does have prescription eye glasses when she drives.   Past Medical History:  Diagnosis Date   No known health problems     Current Outpatient Medications  Medication Sig Dispense Refill   amoxicillin-clavulanate (AUGMENTIN) 875-125 MG tablet Take 1 tablet by mouth 2 (two) times daily. 20 tablet 0   Biotin 1 MG CAPS Take by mouth.     Cholecalciferol (VITAMIN D3) 125 MCG (5000 UT) CAPS Take by mouth.     fluticasone (FLONASE) 50 MCG/ACT nasal spray Place 2 sprays into both nostrils daily. 16 g 6   hydrOXYzine (ATARAX/VISTARIL) 10 MG tablet Take 1 tablet (10 mg total) by mouth daily as needed. 30 tablet 0   ipratropium (ATROVENT) 0.06 % nasal spray Place 2 sprays into both nostrils 4 (four) times daily. For up to 5-7 days then stop. 15 mL 0   levonorgestrel (MIRENA) 20 MCG/24HR IUD 1 Intra Uterine Device (1 each total)  by Intrauterine route once for 1 dose. 1 each 0   loratadine (CLARITIN) 10 MG tablet Take 10 mg by mouth daily as needed for allergies.     Multiple Vitamin (MULTIVITAMIN) tablet Take 1 tablet by mouth daily.     predniSONE (DELTASONE) 20 MG tablet Take daily with food. Start with '40mg'$  (2 pills) x 2 days, then reduce to '20mg'$  (1 pill) x 2 days, then '10mg'$  (half pill) x 2 days 7 tablet 0   sertraline (ZOLOFT) 100 MG tablet Take 1 tablet (100 mg total) by mouth daily. (Patient not taking: Reported on 07/12/2022) 90 tablet 0   No current facility-administered medications for this visit.    Allergies  Allergen Reactions   Azithromycin Hives    Family History  Problem Relation Age of Onset   Arthritis Mother    Cancer Mother        cervical   Depression Mother    Diabetes Mother    Heart disease Mother    Hyperlipidemia Mother    Hypertension Mother    Stroke Mother    Depression Father    Ulcers Father    Depression Brother    Arthritis Maternal Grandmother    Asthma Maternal Grandmother    Cancer Maternal Grandmother        breast   Depression Maternal Grandmother    Diabetes Maternal Grandmother    Heart disease Maternal Grandmother    Hypertension Maternal Grandmother    Stroke Maternal Grandmother  Vision loss Maternal Grandmother    Arthritis Maternal Grandfather    Alcohol abuse Maternal Grandfather    Cancer Maternal Grandfather    Hypertension Maternal Grandfather    Stroke Maternal Grandfather    Heart attack Maternal Grandfather    Arthritis Paternal Grandmother    Diabetes Paternal Grandmother    Hypertension Paternal Grandmother    Vision loss Paternal Grandmother    Ovarian cancer Neg Hx    Colon cancer Neg Hx     Social History   Socioeconomic History   Marital status: Single    Spouse name: Not on file   Number of children: 3   Years of education: College   Highest education level: Not on file  Occupational History   Occupation: [employer] Employee   Tobacco Use   Smoking status: Former    Packs/day: 0.10    Years: 5.00    Total pack years: 0.50    Types: Cigarettes    Quit date: 09/17/2018    Years since quitting: 4.1   Smokeless tobacco: Never   Tobacco comments:    Quit prior 2 years, without treatment. Pt states she's planning quit on 04/19/18  Vaping Use   Vaping Use: Never used  Substance and Sexual Activity   Alcohol use: No   Drug use: No   Sexual activity: Not Currently    Birth control/protection: I.U.D.  Other Topics Concern   Not on file  Social History Narrative   Not on file   Social Determinants of Health   Financial Resource Strain: Not on file  Food Insecurity: Not on file  Transportation Needs: Not on file  Physical Activity: Not on file  Stress: Not on file  Social Connections: Not on file  Intimate Partner Violence: Not on file     Constitutional: Patient reports headaches.  Denies fever, malaise, fatigue, or abrupt weight changes.  HEENT: Patient reports intermittent vision changes.  Denies eye pain, eye redness, ear pain, ringing in the ears, wax buildup, runny nose, nasal congestion, bloody nose, or sore throat. Respiratory: Denies difficulty breathing, shortness of breath, cough or sputum production.   Cardiovascular: Denies chest pain, chest tightness, palpitations or swelling in the hands or feet.  Gastrointestinal: Patient reports intermittent nausea and vomiting.  Denies abdominal pain, bloating, constipation, diarrhea or blood in the stool.  Neurological: Patient reports sensitivity to light, sound and lightheadedness.  Denies difficulty with memory, difficulty with speech or problems with balance and coordination.    No other specific complaints in a complete review of systems (except as listed in HPI above).    Observations/Objective:  Wt Readings from Last 3 Encounters:  07/12/22 261 lb (118.4 kg)  12/17/21 278 lb (126.1 kg)  06/02/21 260 lb (117.9 kg)    General: Appears her  stated age, obese, in NAD. HEENT: Head: normal shape and size;  Pulmonary/Chest: No respiratory distress.  Neurological: Alert and oriented.  Coordination normal.   BMET    Component Value Date/Time   NA 139 06/04/2021 0824   K 4.5 06/04/2021 0824   CL 102 06/04/2021 0824   CO2 24 06/04/2021 0824   GLUCOSE 92 06/04/2021 0824   BUN 11 06/04/2021 0824   CREATININE 0.94 06/04/2021 0824   CALCIUM 9.4 06/04/2021 0824   GFRNONAA 94 04/05/2017 1142   GFRNONAA 99 04/14/2016 0000   GFRAA 109 04/05/2017 1142   GFRAA 114 04/14/2016 0000    Lipid Panel     Component Value Date/Time   CHOL 165 06/04/2021  0824   CHOL 115 04/14/2016 0000   TRIG 50 06/04/2021 0824   TRIG 36 04/14/2016 0000   HDL 61 06/04/2021 0824   CHOLHDL 2.7 06/04/2021 0824   CHOLHDL 2.2 04/14/2016 0000   VLDL 7 04/14/2016 0000   LDLCALC 94 06/04/2021 0824   LDLCALC 56 04/14/2016 0000    CBC    Component Value Date/Time   WBC 4.9 04/05/2017 1142   WBC 7.0 04/26/2014 2052   RBC 4.53 04/05/2017 1142   RBC 4.04 04/26/2014 2052   HGB 12.8 04/05/2017 1142   HCT 38.9 04/05/2017 1142   PLT 316 04/05/2017 1142   MCV 86 04/05/2017 1142   MCV 75 (L) 04/26/2014 2052   MCH 28.3 04/05/2017 1142   MCH 24.1 (L) 04/26/2014 2052   MCHC 32.9 04/05/2017 1142   MCHC 32.1 04/26/2014 2052   RDW 13.5 04/05/2017 1142   RDW 14.3 04/26/2014 2052   LYMPHSABS 1.9 04/05/2017 1142   LYMPHSABS 1.1 04/26/2014 2052   MONOABS 0.7 04/26/2014 2052   EOSABS 0.1 04/05/2017 1142   EOSABS 0.1 04/26/2014 2052   BASOSABS 0.0 04/05/2017 1142   BASOSABS 0.0 04/26/2014 2052    Hgb A1C Lab Results  Component Value Date   HGBA1C 5.3 06/04/2021        Assessment and Plan:  Frequent Headaches:  Try to identify and avoid triggers Encourage adequate sleep and adequate water intake Rx for Fioricet to take as needed for headaches  Schedule appointment for annual exam  Follow Up Instructions:    I discussed the assessment and  treatment plan with the patient. The patient was provided an opportunity to ask questions and all were answered. The patient agreed with the plan and demonstrated an understanding of the instructions.   The patient was advised to call back or seek an in-person evaluation if the symptoms worsen or if the condition fails to improve as anticipated.   Webb Silversmith, NP

## 2022-11-29 ENCOUNTER — Encounter: Payer: Managed Care, Other (non HMO) | Admitting: Internal Medicine

## 2022-11-29 NOTE — Progress Notes (Deleted)
Subjective:    Patient ID: Margaret Potter, female    DOB: August 05, 1986, 37 y.o.   MRN: [id number]  HPI  Patient presents to clinic today for her annual exam.  Flu: 05/2021 Tetanus: 05/2021 COVID: Never Pap smear: 10/2018 Dentist:  Diet: Exercise:  Review of Systems     Past Medical History:  Diagnosis Date   No known health problems     Current Outpatient Medications  Medication Sig Dispense Refill   Biotin 1 MG CAPS Take by mouth.     butalbital-acetaminophen-caffeine (FIORICET) 50-325-40 MG tablet Take 1 tablet by mouth every 6 (six) hours as needed for headache. 14 tablet 2   Cholecalciferol (VITAMIN D3) 125 MCG (5000 UT) CAPS Take by mouth.     fluticasone (FLONASE) 50 MCG/ACT nasal spray Place 2 sprays into both nostrils daily. 16 g 6   hydrOXYzine (ATARAX/VISTARIL) 10 MG tablet Take 1 tablet (10 mg total) by mouth daily as needed. 30 tablet 0   ipratropium (ATROVENT) 0.06 % nasal spray Place 2 sprays into both nostrils 4 (four) times daily. For up to 5-7 days then stop. 15 mL 0   levonorgestrel (MIRENA) 20 MCG/24HR IUD 1 Intra Uterine Device (1 each total) by Intrauterine route once for 1 dose. 1 each 0   loratadine (CLARITIN) 10 MG tablet Take 10 mg by mouth daily as needed for allergies.     Multiple Vitamin (MULTIVITAMIN) tablet Take 1 tablet by mouth daily.     sertraline (ZOLOFT) 100 MG tablet Take 1 tablet (100 mg total) by mouth daily. 90 tablet 0   No current facility-administered medications for this visit.    Allergies  Allergen Reactions   Azithromycin Hives    Family History  Problem Relation Age of Onset   Arthritis Mother    Cancer Mother        cervical   Depression Mother    Diabetes Mother    Heart disease Mother    Hyperlipidemia Mother    Hypertension Mother    Stroke Mother    Depression Father    Ulcers Father    Depression Brother    Arthritis Maternal Grandmother    Asthma Maternal Grandmother    Cancer Maternal  Grandmother        breast   Depression Maternal Grandmother    Diabetes Maternal Grandmother    Heart disease Maternal Grandmother    Hypertension Maternal Grandmother    Stroke Maternal Grandmother    Vision loss Maternal Grandmother    Arthritis Maternal Grandfather    Alcohol abuse Maternal Grandfather    Cancer Maternal Grandfather    Hypertension Maternal Grandfather    Stroke Maternal Grandfather    Heart attack Maternal Grandfather    Arthritis Paternal Grandmother    Diabetes Paternal Grandmother    Hypertension Paternal Grandmother    Vision loss Paternal Grandmother    Ovarian cancer Neg Hx    Colon cancer Neg Hx     Social History   Socioeconomic History   Marital status: Single    Spouse name: Not on file   Number of children: 3   Years of education: College   Highest education level: Not on file  Occupational History   Occupation: [employer] Employee  Tobacco Use   Smoking status: Former    Packs/day: 0.10    Years: 5.00    Additional pack years: 0.00    Total pack years: 0.50    Types: Cigarettes    Quit date: 09/17/2018  Years since quitting: 4.2   Smokeless tobacco: Never   Tobacco comments:    Quit prior 2 years, without treatment. Pt states she's planning quit on 04/19/18  Vaping Use   Vaping Use: Never used  Substance and Sexual Activity   Alcohol use: No   Drug use: No   Sexual activity: Not Currently    Birth control/protection: I.U.D.  Other Topics Concern   Not on file  Social History Narrative   Not on file   Social Determinants of Health   Financial Resource Strain: Not on file  Food Insecurity: Not on file  Transportation Needs: Not on file  Physical Activity: Not on file  Stress: Not on file  Social Connections: Not on file  Intimate Partner Violence: Not on file     Constitutional: Patient reports frequent headaches.  Denies fever, malaise, fatigue, or abrupt weight changes.  HEENT: Denies eye pain, eye redness, ear pain,  ringing in the ears, wax buildup, runny nose, nasal congestion, bloody nose, or sore throat. Respiratory: Denies difficulty breathing, shortness of breath, cough or sputum production.   Cardiovascular: Denies chest pain, chest tightness, palpitations or swelling in the hands or feet.  Gastrointestinal: Denies abdominal pain, bloating, constipation, diarrhea or blood in the stool.  GU: Denies urgency, frequency, pain with urination, burning sensation, blood in urine, odor or discharge. Musculoskeletal: Denies decrease in range of motion, difficulty with gait, muscle pain or joint pain and swelling.  Skin: Denies redness, rashes, lesions or ulcercations.  Neurological: Denies dizziness, difficulty with memory, difficulty with speech or problems with balance and coordination.  Psych: Patient has a history of anxiety and depression.  Denies SI/HI.  No other specific complaints in a complete review of systems (except as listed in HPI above).  Objective:   Physical Exam   There were no vitals taken for this visit. Wt Readings from Last 3 Encounters:  07/12/22 261 lb (118.4 kg)  12/17/21 278 lb (126.1 kg)  06/02/21 260 lb (117.9 kg)    General: Appears their stated age, well developed, well nourished in NAD. Skin: Warm, dry and intact. No rashes, lesions or ulcerations noted. HEENT: Head: normal shape and size; Eyes: sclera white, no icterus, conjunctiva pink, PERRLA and EOMs intact; Ears: Tm's gray and intact, normal light reflex; Nose: mucosa pink and moist, septum midline; Throat/Mouth: Teeth present, mucosa pink and moist, no exudate, lesions or ulcerations noted.  Neck:  Neck supple, trachea midline. No masses, lumps or thyromegaly present.  Cardiovascular: Normal rate and rhythm. S1,S2 noted.  No murmur, rubs or gallops noted. No JVD or BLE edema. No carotid bruits noted. Pulmonary/Chest: Normal effort and positive vesicular breath sounds. No respiratory distress. No wheezes, rales or  ronchi noted.  Abdomen: Soft and nontender. Normal bowel sounds. No distention or masses noted. Liver, spleen and kidneys non palpable. Musculoskeletal: Normal range of motion. No signs of joint swelling. No difficulty with gait.  Neurological: Alert and oriented. Cranial nerves II-XII grossly intact. Coordination normal.  Psychiatric: Mood and affect normal. Behavior is normal. Judgment and thought content normal.    BMET    Component Value Date/Time   NA 139 06/04/2021 0824   K 4.5 06/04/2021 0824   CL 102 06/04/2021 0824   CO2 24 06/04/2021 0824   GLUCOSE 92 06/04/2021 0824   BUN 11 06/04/2021 0824   CREATININE 0.94 06/04/2021 0824   CALCIUM 9.4 06/04/2021 0824   GFRNONAA 94 04/05/2017 1142   GFRNONAA 99 04/14/2016 0000   GFRAA  109 04/05/2017 1142   GFRAA 114 04/14/2016 0000    Lipid Panel     Component Value Date/Time   CHOL 165 06/04/2021 0824   CHOL 115 04/14/2016 0000   TRIG 50 06/04/2021 0824   TRIG 36 04/14/2016 0000   HDL 61 06/04/2021 0824   CHOLHDL 2.7 06/04/2021 0824   CHOLHDL 2.2 04/14/2016 0000   VLDL 7 04/14/2016 0000   LDLCALC 94 06/04/2021 0824   LDLCALC 56 04/14/2016 0000    CBC    Component Value Date/Time   WBC 4.9 04/05/2017 1142   WBC 7.0 04/26/2014 2052   RBC 4.53 04/05/2017 1142   RBC 4.04 04/26/2014 2052   HGB 12.8 04/05/2017 1142   HCT 38.9 04/05/2017 1142   PLT 316 04/05/2017 1142   MCV 86 04/05/2017 1142   MCV 75 (L) 04/26/2014 2052   MCH 28.3 04/05/2017 1142   MCH 24.1 (L) 04/26/2014 2052   MCHC 32.9 04/05/2017 1142   MCHC 32.1 04/26/2014 2052   RDW 13.5 04/05/2017 1142   RDW 14.3 04/26/2014 2052   LYMPHSABS 1.9 04/05/2017 1142   LYMPHSABS 1.1 04/26/2014 2052   MONOABS 0.7 04/26/2014 2052   EOSABS 0.1 04/05/2017 1142   EOSABS 0.1 04/26/2014 2052   BASOSABS 0.0 04/05/2017 1142   BASOSABS 0.0 04/26/2014 2052    Hgb A1C Lab Results  Component Value Date   HGBA1C 5.3 06/04/2021           Assessment & Plan:    Preventative Health Maintenance:  Encouraged her to get a flu shot in the fall Tetanus UTD Encouraged her to get her COVID-vaccine Pap smear UTD Encouraged her to consume a balanced diet and exercise regimen Advised her to see a dentist annually We will check CBC, c-Met, lipid and A1c today  RTC in 6 months, follow-up chronic conditions Nicki Reaper, NP

## 2022-12-09 ENCOUNTER — Encounter: Payer: Self-pay | Admitting: Internal Medicine

## 2022-12-09 ENCOUNTER — Ambulatory Visit (INDEPENDENT_AMBULATORY_CARE_PROVIDER_SITE_OTHER): Payer: Managed Care, Other (non HMO) | Admitting: Internal Medicine

## 2022-12-09 VITALS — BP 128/84 | HR 69 | Temp 96.9°F | Ht 69.0 in | Wt 252.0 lb

## 2022-12-09 DIAGNOSIS — J019 Acute sinusitis, unspecified: Secondary | ICD-10-CM | POA: Diagnosis not present

## 2022-12-09 DIAGNOSIS — J0110 Acute frontal sinusitis, unspecified: Secondary | ICD-10-CM

## 2022-12-09 DIAGNOSIS — Z0001 Encounter for general adult medical examination with abnormal findings: Secondary | ICD-10-CM

## 2022-12-09 DIAGNOSIS — E66812 Obesity, class 2: Secondary | ICD-10-CM

## 2022-12-09 DIAGNOSIS — R7309 Other abnormal glucose: Secondary | ICD-10-CM | POA: Diagnosis not present

## 2022-12-09 DIAGNOSIS — R739 Hyperglycemia, unspecified: Secondary | ICD-10-CM

## 2022-12-09 DIAGNOSIS — Z6837 Body mass index (BMI) 37.0-37.9, adult: Secondary | ICD-10-CM

## 2022-12-09 MED ORDER — FLUTICASONE PROPIONATE 50 MCG/ACT NA SUSP
2.0000 | Freq: Every day | NASAL | 6 refills | Status: DC
Start: 2022-12-09 — End: 2023-12-11

## 2022-12-09 MED ORDER — LORATADINE 10 MG PO TABS: 10.0000 mg | ORAL_TABLET | Freq: Every day | ORAL | 1 refills | Status: AC | PRN

## 2022-12-09 NOTE — Assessment & Plan Note (Signed)
Encouraged diet and exercise for weight loss ?

## 2022-12-09 NOTE — Patient Instructions (Signed)

## 2022-12-09 NOTE — Progress Notes (Signed)
Subjective:    Patient ID: Margaret Potter, female    DOB: [birth year], 37 y.o.   MRN: [id number]  HPI  Patient presents to clinic today for her annual exam.  Flu: 05/2021 Tetanus: 05/2021 COVID: never Pap smear: 10/2018 Dentist: biannually  Diet: She is not eating meat. She consumes fruits and veggies. She does not eat fried foods. She drinks mostly water. Exercise: None  Review of Systems     Past Medical History:  Diagnosis Date   No known health problems     Current Outpatient Medications  Medication Sig Dispense Refill   Biotin 1 MG CAPS Take by mouth.     butalbital-acetaminophen-caffeine (FIORICET) 50-325-40 MG tablet Take 1 tablet by mouth every 6 (six) hours as needed for headache. 14 tablet 2   Cholecalciferol (VITAMIN D3) 125 MCG (5000 UT) CAPS Take by mouth.     fluticasone (FLONASE) 50 MCG/ACT nasal spray Place 2 sprays into both nostrils daily. 16 g 6   hydrOXYzine (ATARAX/VISTARIL) 10 MG tablet Take 1 tablet (10 mg total) by mouth daily as needed. 30 tablet 0   ipratropium (ATROVENT) 0.06 % nasal spray Place 2 sprays into both nostrils 4 (four) times daily. For up to 5-7 days then stop. 15 mL 0   levonorgestrel (MIRENA) 20 MCG/24HR IUD 1 Intra Uterine Device (1 each total) by Intrauterine route once for 1 dose. 1 each 0   loratadine (CLARITIN) 10 MG tablet Take 10 mg by mouth daily as needed for allergies.     Multiple Vitamin (MULTIVITAMIN) tablet Take 1 tablet by mouth daily.     sertraline (ZOLOFT) 100 MG tablet Take 1 tablet (100 mg total) by mouth daily. 90 tablet 0   No current facility-administered medications for this visit.    Allergies  Allergen Reactions   Azithromycin Hives    Family History  Problem Relation Age of Onset   Arthritis Mother    Cancer Mother        cervical   Depression Mother    Diabetes Mother    Heart disease Mother    Hyperlipidemia Mother    Hypertension Mother    Stroke Mother    Depression Father    Ulcers  Father    Depression Brother    Arthritis Maternal Grandmother    Asthma Maternal Grandmother    Cancer Maternal Grandmother        breast   Depression Maternal Grandmother    Diabetes Maternal Grandmother    Heart disease Maternal Grandmother    Hypertension Maternal Grandmother    Stroke Maternal Grandmother    Vision loss Maternal Grandmother    Arthritis Maternal Grandfather    Alcohol abuse Maternal Grandfather    Cancer Maternal Grandfather    Hypertension Maternal Grandfather    Stroke Maternal Grandfather    Heart attack Maternal Grandfather    Arthritis Paternal Grandmother    Diabetes Paternal Grandmother    Hypertension Paternal Grandmother    Vision loss Paternal Grandmother    Ovarian cancer Neg Hx    Colon cancer Neg Hx     Social History   Socioeconomic History   Marital status: Single    Spouse name: Not on file   Number of children: 3   Years of education: College   Highest education level: Not on file  Occupational History   Occupation: [employer] Employee  Tobacco Use   Smoking status: Former    Packs/day: 0.10    Years: 5.00  Additional pack years: 0.00    Total pack years: 0.50    Types: Cigarettes    Quit date: 09/17/2018    Years since quitting: 4.2   Smokeless tobacco: Never   Tobacco comments:    Quit prior 2 years, without treatment. Pt states she's planning quit on 04/19/18  Vaping Use   Vaping Use: Never used  Substance and Sexual Activity   Alcohol use: No   Drug use: No   Sexual activity: Not Currently    Birth control/protection: I.U.D.  Other Topics Concern   Not on file  Social History Narrative   Not on file   Social Determinants of Health   Financial Resource Strain: Not on file  Food Insecurity: Not on file  Transportation Needs: Not on file  Physical Activity: Not on file  Stress: Not on file  Social Connections: Not on file  Intimate Partner Violence: Not on file     Constitutional: Patient reports intermittent  headaches.  Denies fever, malaise, fatigue, or abrupt weight changes.  HEENT: Denies eye pain, eye redness, ear pain, ringing in the ears, wax buildup, runny nose, nasal congestion, bloody nose, or sore throat. Respiratory: Denies difficulty breathing, shortness of breath, cough or sputum production.   Cardiovascular: Denies chest pain, chest tightness, palpitations or swelling in the hands or feet.  Gastrointestinal: Denies abdominal pain, bloating, constipation, diarrhea or blood in the stool.  GU: Pt reports intermittent vaginal discharge around the time of her period. Denies urgency, frequency, pain with urination, burning sensation, blood in urine, odor. Musculoskeletal: Denies decrease in range of motion, difficulty with gait, muscle pain or joint pain and swelling.  Skin: Denies redness, rashes, lesions or ulcercations.  Neurological: Denies dizziness, difficulty with memory, difficulty with speech or problems with balance and coordination.  Psych: Patient has a history of anxiety and depression.  Denies SI/HI.  No other specific complaints in a complete review of systems (except as listed in HPI above).  Objective:   Physical Exam   BP 128/84 (BP Location: Right Arm, Patient Position: Sitting, Cuff Size: Large)   Pulse 69   Temp (!) 96.9 F (36.1 C) (Temporal)   Ht 5\' 9"  (1.753 m)   Wt 252 lb (114.3 kg)   SpO2 99%   BMI 37.21 kg/m   Wt Readings from Last 3 Encounters:  07/12/22 261 lb (118.4 kg)  12/17/21 278 lb (126.1 kg)  06/02/21 260 lb (117.9 kg)    General: Appears her stated age, obese, in NAD. Skin: Warm, dry and intact.  HEENT: Head: normal shape and size; Eyes: sclera white, no icterus, conjunctiva pink, PERRLA and EOMs intact; Neck:  Neck supple, trachea midline. No masses, lumps or thyromegaly present.  Cardiovascular: Normal rate and rhythm. S1,S2 noted.  No murmur, rubs or gallops noted. No JVD or BLE edema. Pulmonary/Chest: Normal effort and positive  vesicular breath sounds. No respiratory distress. No wheezes, rales or ronchi noted.  Abdomen:  Normal bowel sounds.  Musculoskeletal: Strength 5/5 BUE/BLE. No difficulty with gait.  Neurological: Alert and oriented. Cranial nerves II-XII grossly intact. Coordination normal.  Psychiatric: Mood and affect normal. Behavior is normal. Judgment and thought content normal.     BMET    Component Value Date/Time   NA 139 06/04/2021 0824   K 4.5 06/04/2021 0824   CL 102 06/04/2021 0824   CO2 24 06/04/2021 0824   GLUCOSE 92 06/04/2021 0824   BUN 11 06/04/2021 0824   CREATININE 0.94 06/04/2021 0824   CALCIUM  9.4 06/04/2021 0824   GFRNONAA 94 04/05/2017 1142   GFRNONAA 99 04/14/2016 0000   GFRAA 109 04/05/2017 1142   GFRAA 114 04/14/2016 0000    Lipid Panel     Component Value Date/Time   CHOL 165 06/04/2021 0824   CHOL 115 04/14/2016 0000   TRIG 50 06/04/2021 0824   TRIG 36 04/14/2016 0000   HDL 61 06/04/2021 0824   CHOLHDL 2.7 06/04/2021 0824   CHOLHDL 2.2 04/14/2016 0000   VLDL 7 04/14/2016 0000   LDLCALC 94 06/04/2021 0824   LDLCALC 56 04/14/2016 0000    CBC    Component Value Date/Time   WBC 4.9 04/05/2017 1142   WBC 7.0 04/26/2014 2052   RBC 4.53 04/05/2017 1142   RBC 4.04 04/26/2014 2052   HGB 12.8 04/05/2017 1142   HCT 38.9 04/05/2017 1142   PLT 316 04/05/2017 1142   MCV 86 04/05/2017 1142   MCV 75 (L) 04/26/2014 2052   MCH 28.3 04/05/2017 1142   MCH 24.1 (L) 04/26/2014 2052   MCHC 32.9 04/05/2017 1142   MCHC 32.1 04/26/2014 2052   RDW 13.5 04/05/2017 1142   RDW 14.3 04/26/2014 2052   LYMPHSABS 1.9 04/05/2017 1142   LYMPHSABS 1.1 04/26/2014 2052   MONOABS 0.7 04/26/2014 2052   EOSABS 0.1 04/05/2017 1142   EOSABS 0.1 04/26/2014 2052   BASOSABS 0.0 04/05/2017 1142   BASOSABS 0.0 04/26/2014 2052    Hgb A1C Lab Results  Component Value Date   HGBA1C 5.3 06/04/2021           Assessment & Plan:   Preventative Health Maintenance:  Encouraged her  to get a flu shot in the fall Tetanus UTD Encouraged her to get her COVID-vaccine Pap smear UTD Her to consume a balanced diet and exercise regimen Advised her to see an eye doctor and dentist annually We will check CBC, c-Met, lipid, A1c today  RTC in 6 months, follow-up chronic additions Nicki Reaper, NP

## 2022-12-09 NOTE — Progress Notes (Deleted)
  Subjective:     Patient ID: Margaret Potter, female   DOB: October 26, 1985, 37 y.o.   MRN: [id number]  HPI   Review of Systems     Objective:   Physical Exam     Assessment:     ***    Plan:     ***

## 2022-12-10 LAB — COMPREHENSIVE METABOLIC PANEL
ALT: 12 IU/L (ref 0–32)
AST: 17 IU/L (ref 0–40)
Albumin/Globulin Ratio: 1.6 (ref 1.2–2.2)
Albumin: 3.9 g/dL (ref 3.9–4.9)
Alkaline Phosphatase: 53 IU/L (ref 44–121)
BUN/Creatinine Ratio: 4 — ABNORMAL LOW (ref 9–23)
BUN: 3 mg/dL — ABNORMAL LOW (ref 6–20)
Bilirubin Total: 0.5 mg/dL (ref 0.0–1.2)
CO2: 23 mmol/L (ref 20–29)
Calcium: 8.9 mg/dL (ref 8.7–10.2)
Chloride: 105 mmol/L (ref 96–106)
Creatinine, Ser: 0.67 mg/dL (ref 0.57–1.00)
Globulin, Total: 2.5 g/dL (ref 1.5–4.5)
Glucose: 84 mg/dL (ref 70–99)
Potassium: 4.1 mmol/L (ref 3.5–5.2)
Sodium: 140 mmol/L (ref 134–144)
Total Protein: 6.4 g/dL (ref 6.0–8.5)
eGFR: 116 mL/min/{1.73_m2} (ref >59)

## 2022-12-10 LAB — CBC WITH DIFFERENTIAL/PLATELET
Basophils Absolute: 0.1 10*3/uL (ref 0.0–0.2)
Basos: 1 %
EOS (ABSOLUTE): 0.4 10*3/uL (ref 0.0–0.4)
Eos: 9 %
Hematocrit: 32.6 % — ABNORMAL LOW (ref 34.0–46.6)
Hemoglobin: 11.1 g/dL (ref 11.1–15.9)
Immature Grans (Abs): 0 10*3/uL (ref 0.0–0.1)
Immature Granulocytes: 0 %
Lymphocytes Absolute: 2.1 10*3/uL (ref 0.7–3.1)
Lymphs: 42 %
MCH: 28.2 pg (ref 26.6–33.0)
MCHC: 34 g/dL (ref 31.5–35.7)
MCV: 83 fL (ref 79–97)
Monocytes Absolute: 0.5 10*3/uL (ref 0.1–0.9)
Monocytes: 10 %
Neutrophils Absolute: 1.9 10*3/uL (ref 1.4–7.0)
Neutrophils: 38 %
Platelets: 319 10*3/uL (ref 150–450)
RBC: 3.93 x10E6/uL (ref 3.77–5.28)
RDW: 11.9 % (ref 11.7–15.4)
WBC: 4.9 10*3/uL (ref 3.4–10.8)

## 2022-12-10 LAB — HEMOGLOBIN A1C
Est. average glucose Bld gHb Est-mCnc: 103 mg/dL
Hgb A1c MFr Bld: 5.2 % (ref 4.8–5.6)

## 2022-12-10 LAB — LIPID PANEL
Chol/HDL Ratio: 2.4 ratio (ref 0.0–4.4)
Cholesterol, Total: 113 mg/dL (ref 100–199)
HDL: 48 mg/dL (ref >39)
LDL Chol Calc (NIH): 55 mg/dL (ref 0–99)
Triglycerides: 38 mg/dL (ref 0–149)
VLDL Cholesterol Cal: 10 mg/dL (ref 5–40)

## 2022-12-12 ENCOUNTER — Encounter: Payer: Self-pay | Admitting: Internal Medicine

## 2023-06-13 ENCOUNTER — Ambulatory Visit: Payer: Managed Care, Other (non HMO) | Admitting: Internal Medicine

## 2023-06-22 ENCOUNTER — Ambulatory Visit: Payer: Managed Care, Other (non HMO) | Admitting: Internal Medicine

## 2023-06-22 ENCOUNTER — Encounter: Payer: Self-pay | Admitting: Internal Medicine

## 2023-06-22 VITALS — BP 100/75 | HR 93 | Ht 69.0 in | Wt 256.0 lb

## 2023-06-22 DIAGNOSIS — E66812 Obesity, class 2: Secondary | ICD-10-CM

## 2023-06-22 DIAGNOSIS — R519 Headache, unspecified: Secondary | ICD-10-CM

## 2023-06-22 DIAGNOSIS — Z23 Encounter for immunization: Secondary | ICD-10-CM | POA: Diagnosis not present

## 2023-06-22 DIAGNOSIS — F32A Depression, unspecified: Secondary | ICD-10-CM

## 2023-06-22 DIAGNOSIS — Z6837 Body mass index (BMI) 37.0-37.9, adult: Secondary | ICD-10-CM

## 2023-06-22 DIAGNOSIS — J3089 Other allergic rhinitis: Secondary | ICD-10-CM | POA: Diagnosis not present

## 2023-06-22 DIAGNOSIS — F419 Anxiety disorder, unspecified: Secondary | ICD-10-CM

## 2023-06-22 MED ORDER — SERTRALINE HCL 100 MG PO TABS: 150.0000 mg | ORAL_TABLET | Freq: Every day | ORAL | 1 refills | Status: DC

## 2023-06-22 MED ORDER — METHYLPREDNISOLONE ACETATE 80 MG/ML IJ SUSP
80.0000 mg | Freq: Once | INTRAMUSCULAR | Status: AC
Start: 2023-06-22 — End: 2023-06-22
  Administered 2023-06-22: 80 mg via INTRAMUSCULAR

## 2023-06-22 NOTE — Patient Instructions (Signed)

## 2023-06-22 NOTE — Assessment & Plan Note (Signed)
Avoid triggers Continue Fioricet as needed

## 2023-06-22 NOTE — Assessment & Plan Note (Signed)
Encouraged diet and exercise for weight loss ?

## 2023-06-22 NOTE — Assessment & Plan Note (Signed)
Increase sertraline 250 mg daily Continue hydroxyzine as needed Support offered

## 2023-06-22 NOTE — Progress Notes (Signed)
Subjective:    Patient ID: Margaret Potter, female    DOB: 21-Nov-1985, 37 y.o.   MRN: [id number]  HPI  Patient presents to clinic today for 59-month follow-up of chronic conditions.  Anxiety and depression: Chronic, managed on sertraline and hydroxyzine. She is having some days with increase stress that are bothersome to her. She is not currently seeing a therapist.  She denies SI/HI. She would like FMLA form completion for 2 8 hour days per month.  Headaches: These occur 1 every few month.  Triggered by stress.  She takes fioricet as needed with some relief of symptoms.  She does not follow with neurology.  She also reports headache, runny nose, sore throat and cough.  This started a few days ago. She is blowing clear mucous out her nose. She denies difficultly swallowing. The cough is productive of yellow mucous. She denies nasal congestion, ear pain, shortness of breath, nausea, vomiting or diarrhea. She denies fever, chills or body aches. She  has tried zicam OTC with some relief of symptoms.  She reports she typically gets laryngitis this time of year. She has not had sick contacts that s he is aware of.    Review of Systems     Past Medical History:  Diagnosis Date   No known health problems     Current Outpatient Medications  Medication Sig Dispense Refill   Biotin 1 MG CAPS Take by mouth.     butalbital-acetaminophen-caffeine (FIORICET) 50-325-40 MG tablet Take 1 tablet by mouth every 6 (six) hours as needed for headache. 14 tablet 2   Cholecalciferol (VITAMIN D3) 125 MCG (5000 UT) CAPS Take by mouth.     fluticasone (FLONASE) 50 MCG/ACT nasal spray Place 2 sprays into both nostrils daily. 16 g 6   hydrOXYzine (ATARAX/VISTARIL) 10 MG tablet Take 1 tablet (10 mg total) by mouth daily as needed. 30 tablet 0   ipratropium (ATROVENT) 0.06 % nasal spray Place 2 sprays into both nostrils 4 (four) times daily. For up to 5-7 days then stop. 15 mL 0   levonorgestrel (MIRENA) 20  MCG/24HR IUD 1 Intra Uterine Device (1 each total) by Intrauterine route once for 1 dose. 1 each 0   loratadine (CLARITIN) 10 MG tablet Take 1 tablet (10 mg total) by mouth daily as needed for allergies. 90 tablet 1   Multiple Vitamin (MULTIVITAMIN) tablet Take 1 tablet by mouth daily.     sertraline (ZOLOFT) 100 MG tablet Take 1 tablet (100 mg total) by mouth daily. 90 tablet 0   No current facility-administered medications for this visit.    Allergies  Allergen Reactions   Azithromycin Hives    Family History  Problem Relation Age of Onset   Arthritis Mother    Cancer Mother        cervical   Depression Mother    Diabetes Mother    Heart disease Mother    Hyperlipidemia Mother    Hypertension Mother    Stroke Mother    Depression Father    Ulcers Father    Depression Brother    Arthritis Maternal Grandmother    Asthma Maternal Grandmother    Cancer Maternal Grandmother        breast   Depression Maternal Grandmother    Diabetes Maternal Grandmother    Heart disease Maternal Grandmother    Hypertension Maternal Grandmother    Stroke Maternal Grandmother    Vision loss Maternal Grandmother    Arthritis Maternal Grandfather  Alcohol abuse Maternal Grandfather    Cancer Maternal Grandfather    Hypertension Maternal Grandfather    Stroke Maternal Grandfather    Heart attack Maternal Grandfather    Arthritis Paternal Grandmother    Diabetes Paternal Grandmother    Hypertension Paternal Grandmother    Vision loss Paternal Grandmother    Ovarian cancer Neg Hx    Colon cancer Neg Hx     Social History   Socioeconomic History   Marital status: Single    Spouse name: Not on file   Number of children: 3   Years of education: College   Highest education level: Not on file  Occupational History   Occupation: [employer] Employee  Tobacco Use   Smoking status: Former    Current packs/day: 0.00    Average packs/day: 0.1 packs/day for 5.0 years (0.5 ttl pk-yrs)     Types: Cigarettes    Start date: 09/17/2013    Quit date: 09/17/2018    Years since quitting: 4.7   Smokeless tobacco: Never   Tobacco comments:    Quit prior 2 years, without treatment. Pt states she's planning quit on 04/19/18  Vaping Use   Vaping status: Never Used  Substance and Sexual Activity   Alcohol use: No   Drug use: No   Sexual activity: Not Currently    Birth control/protection: I.U.D.  Other Topics Concern   Not on file  Social History Narrative   Not on file   Social Determinants of Health   Financial Resource Strain: Not on file  Food Insecurity: Not on file  Transportation Needs: Not on file  Physical Activity: Not on file  Stress: Not on file  Social Connections: Not on file  Intimate Partner Violence: Not on file     Constitutional: Patient reports headaches.  Denies fever, malaise, fatigue, or abrupt weight changes.  HEENT: Pt reports runny nose, sore throat. Denies eye pain, eye redness, ear pain, ringing in the ears, wax buildup, runny nose, nasal congestion, bloody nose. Respiratory: Pt reports cough. Denies difficulty breathing, shortness of breath.   Cardiovascular: Denies chest pain, chest tightness, palpitations or swelling in the hands or feet.  Gastrointestinal: Denies abdominal pain, bloating, constipation, diarrhea or blood in the stool.  GU: Denies urgency, frequency, pain with urination, burning sensation, blood in urine, odor or discharge. Musculoskeletal: Denies decrease in range of motion, difficulty with gait, muscle pain or joint pain and swelling.  Skin: Denies redness, rashes, lesions or ulcercations.  Neurological: Denies dizziness, difficulty with memory, difficulty with speech or problems with balance and coordination.  Psych: Patient has a history of anxiety and depression.  Denies SI/HI.  No other specific complaints in a complete review of systems (except as listed in HPI above).  Objective:   Physical Exam  BP 100/75   Pulse 93    Ht 5\' 9"  (1.753 m)   Wt 256 lb (116.1 kg)   BMI 37.80 kg/m   Wt Readings from Last 3 Encounters:  12/09/22 252 lb (114.3 kg)  07/12/22 261 lb (118.4 kg)  12/17/21 278 lb (126.1 kg)    General: Appears her stated age, obese, in NAD. Skin: Warm, dry and intact.  HEENT: Head: normal shape and size; Eyes: sclera white, no icterus, conjunctiva pink, PERRLA and EOMs intact; Nose: mucosa boggy and moist, turbinates swollen: Throat: +PND, no exudate noted Neck: No adenopathy noted. Cardiovascular: Normal rate and rhythm. S1,S2 noted.  No murmur, rubs or gallops noted. No JVD or BLE edema.  Pulmonary/Chest:  Normal effort and positive vesicular breath sounds. No respiratory distress. No wheezes, rales or ronchi noted.  Musculoskeletal:  No difficulty with gait.  Neurological: Alert and oriented. Coordination normal.  Psychiatric: Mood and affect normal. Behavior is normal. Judgment and thought content normal.   BMET    Component Value Date/Time   NA 140 12/09/2022 1512   K 4.1 12/09/2022 1512   CL 105 12/09/2022 1512   CO2 23 12/09/2022 1512   GLUCOSE 84 12/09/2022 1512   BUN 3 (L) 12/09/2022 1512   CREATININE 0.67 12/09/2022 1512   CALCIUM 8.9 12/09/2022 1512   GFRNONAA 94 04/05/2017 1142   GFRNONAA 99 04/14/2016 0000   GFRAA 109 04/05/2017 1142   GFRAA 114 04/14/2016 0000    Lipid Panel     Component Value Date/Time   CHOL 113 12/09/2022 1512   CHOL 115 04/14/2016 0000   TRIG 38 12/09/2022 1512   TRIG 36 04/14/2016 0000   HDL 48 12/09/2022 1512   CHOLHDL 2.4 12/09/2022 1512   CHOLHDL 2.2 04/14/2016 0000   VLDL 7 04/14/2016 0000   LDLCALC 55 12/09/2022 1512   LDLCALC 56 04/14/2016 0000    CBC    Component Value Date/Time   WBC 4.9 12/09/2022 1512   WBC 7.0 04/26/2014 2052   RBC 3.93 12/09/2022 1512   RBC 4.04 04/26/2014 2052   HGB 11.1 12/09/2022 1512   HCT 32.6 (L) 12/09/2022 1512   PLT 319 12/09/2022 1512   MCV 83 12/09/2022 1512   MCV 75 (L) 04/26/2014 2052    MCH 28.2 12/09/2022 1512   MCH 24.1 (L) 04/26/2014 2052   MCHC 34.0 12/09/2022 1512   MCHC 32.1 04/26/2014 2052   RDW 11.9 12/09/2022 1512   RDW 14.3 04/26/2014 2052   LYMPHSABS 2.1 12/09/2022 1512   LYMPHSABS 1.1 04/26/2014 2052   MONOABS 0.7 04/26/2014 2052   EOSABS 0.4 12/09/2022 1512   EOSABS 0.1 04/26/2014 2052   BASOSABS 0.1 12/09/2022 1512   BASOSABS 0.0 04/26/2014 2052    Hgb A1C Lab Results  Component Value Date   HGBA1C 5.2 12/09/2022            Assessment & Plan:   Allergic rhinitis:  Start Zyrtec and Flonase OTC x 1 week 80 mg Depo-Medrol IM times  RTC in 6 months, for your annual exam Nicki Reaper, NP

## 2023-10-31 ENCOUNTER — Ambulatory Visit: Payer: Self-pay | Admitting: Internal Medicine

## 2023-10-31 NOTE — Telephone Encounter (Signed)
 Chief Complaint: Anxiety Symptoms: trouble concentrating, overwhelmed, crying Frequency: starting about a week ago and increased Pertinent Negatives: Patient denies CP, SOB Disposition: [] ED /[] Urgent Care (no appt availability in office) / [x] Appointment(In office/virtual)/ []  [facility]/ [] Home Care/ [] Refused Recommended Disposition /[] [facility]/ []  Follow-up with PCP Additional Notes: patient called with complaints of overwhelming anxiety. Patient states she is having trouble concentrating, feeling very overwhelmed and crying. Patient was crying throughout the phone call until the last five minutes of call. Patient states she has been taking her Zoloft regularly but hadn't taken any hydroxyzine. Patient is encourage to take her hydroxyzine to help calm down the anxiety.  No SI or HI. Patient has family in the house with her. Patient states her anxiety seems to get worse the closer she gets to the anniversary of her mother's death which is coming up in the next few months. Patient is asking to see her PCP. Appointment made for patient for 11/01/2023 at 1:40 pm. Patient verbalized understanding of plan and all questions answered.     Copied from CRM [id number]. Topic: Clinical - Red Word Triage >> Oct 31, 2023 11:46 AM [place] B wrote: [facility] that prompted transfer to Nurse Triage: pt called in , has anxiety Reason for Disposition  Patient sounds very upset or troubled to the triager  Answer Assessment - Initial Assessment Questions 1. CONCERN: "Did anything happen that prompted you to call today?"      Overwhelming sense of anxiety 2. ANXIETY SYMPTOMS: "Can you describe how you (your loved one; patient) have been feeling?" (e.g., tense, restless, panicky, anxious, keyed up, overwhelmed, sense of impending doom).      Intense feeling of anxiety and overwhelmed, difficulty concentrating 3. ONSET: "How long have you been feeling this way?" (e.g., hours, days, weeks)      Started about a week ago 4. SEVERITY: "How would you rate the level of anxiety?" (e.g., 0 - 10; or mild, moderate, severe).     severe 5. FUNCTIONAL IMPAIRMENT: "How have these feelings affected your ability to do daily activities?" "Have you had more difficulty than usual doing your normal daily activities?" (e.g., getting better, same, worse; self-care, school, work, interactions)     Difficulty getting work  6. HISTORY: "Have you felt this way before?" "Have you ever been diagnosed with an anxiety problem in the past?" (e.g., generalized anxiety disorder, panic attacks, PTSD). If Yes, ask: "How was this problem treated?" (e.g., medicines, counseling, etc.)     Yes-feelings tend to get worse closer to the anniversary of mom's death 7. RISK OF HARM - SUICIDAL IDEATION: "Do you ever have thoughts of hurting or killing yourself?" If Yes, ask:  "Do you have these feelings now?" "Do you have a plan on how you would do this?"     No 8. TREATMENT:  "What has been done so far to treat this anxiety?" (e.g., medicines, relaxation strategies). "What has helped?"     Taking Zoloft 100 mg-has PRN atarax 10 mg  9. TREATMENT - THERAPIST: "Do you have a counselor or therapist? Name?"     N/A 10. POTENTIAL TRIGGERS: "Do you drink caffeinated beverages (e.g., coffee, colas, teas), and how much daily?" "Do you drink alcohol or use any drugs?" "Have you started any new medicines recently?"       N/A 11. PATIENT SUPPORT: "Who is with you now?" "Who do you live with?" "Do you have family or friends who you can talk to?"  Family at home with patient 63. OTHER SYMPTOMS: "Do you have any other symptoms?" (e.g., feeling depressed, trouble concentrating, trouble sleeping, trouble breathing, palpitations or fast heartbeat, chest pain, sweating, nausea, or diarrhea)       Trouble concentrating  Protocols used: Anxiety and Panic Attack-A-AH

## 2023-11-01 ENCOUNTER — Ambulatory Visit: Admitting: Internal Medicine

## 2023-11-01 ENCOUNTER — Encounter: Payer: Self-pay | Admitting: Internal Medicine

## 2023-11-01 VITALS — BP 118/68 | Ht 69.0 in | Wt 277.4 lb

## 2023-11-01 DIAGNOSIS — F419 Anxiety disorder, unspecified: Secondary | ICD-10-CM

## 2023-11-01 DIAGNOSIS — F32A Depression, unspecified: Secondary | ICD-10-CM

## 2023-11-01 MED ORDER — SERTRALINE HCL 100 MG PO TABS: 150.0000 mg | ORAL_TABLET | Freq: Every day | ORAL | 1 refills | Status: DC

## 2023-11-01 MED ORDER — BUSPIRONE HCL 10 MG PO TABS: 10.0000 mg | ORAL_TABLET | Freq: Two times a day (BID) | ORAL | 0 refills | Status: DC | PRN

## 2023-11-01 MED ORDER — HYDROXYZINE HCL 10 MG PO TABS: 10.0000 mg | ORAL_TABLET | Freq: Every day | ORAL | 1 refills | Status: DC | PRN

## 2023-11-01 NOTE — Telephone Encounter (Signed)
 Will discuss at upcoming appointment

## 2023-11-01 NOTE — Progress Notes (Signed)
 Subjective:    Patient ID: Margaret Potter, female    DOB: August 19, 1985, 38 y.o.   MRN: [id number]  HPI  Discussed the use of AI scribe software for clinical note transcription with the patient, who gave verbal consent to proceed.  [name] is a 38 year old female with anxiety who presents with increased anxiety symptoms.  She experiences increased anxiety, particularly during her working hours from 8:30 AM to 5:30 PM. A significant episode of anxiety occurred the previous day, despite using breathing techniques. Her current anxiety is related to the anniversary of her mother's death, which occurred three years ago. Certain dates, such as her mother's death date and Mother's Day, are particularly challenging.  She is currently taking sertraline daily, at a dose of one and a half tablets in the morning. She also has a prescription for hydroxyzine, which she uses as needed for acute anxiety episodes, although she has not been taking it regularly. Hydroxyzine helps when she feels panicky, but it can make her sleepy. She has not been on a regular regimen with hydroxyzine.  She works a stressful job and has stopped engaging in self-care activities such as spending time in nature and listening to music, which she previously found therapeutic. She wants to resume these activities to help manage her anxiety. No feelings of depression are reported.       Review of Systems     Past Medical History:  Diagnosis Date   No known health problems     Current Outpatient Medications  Medication Sig Dispense Refill   Biotin 1 MG CAPS Take by mouth.     butalbital-acetaminophen-caffeine (FIORICET) 50-325-40 MG tablet Take 1 tablet by mouth every 6 (six) hours as needed for headache. 14 tablet 2   Cholecalciferol (VITAMIN D3) 125 MCG (5000 UT) CAPS Take by mouth.     fluticasone (FLONASE) 50 MCG/ACT nasal spray Place 2 sprays into both nostrils daily. 16 g 6   hydrOXYzine (ATARAX/VISTARIL)  10 MG tablet Take 1 tablet (10 mg total) by mouth daily as needed. 30 tablet 0   ipratropium (ATROVENT) 0.06 % nasal spray Place 2 sprays into both nostrils 4 (four) times daily. For up to 5-7 days then stop. 15 mL 0   levonorgestrel (MIRENA) 20 MCG/24HR IUD 1 Intra Uterine Device (1 each total) by Intrauterine route once for 1 dose. 1 each 0   loratadine (CLARITIN) 10 MG tablet Take 1 tablet (10 mg total) by mouth daily as needed for allergies. 90 tablet 1   Multiple Vitamin (MULTIVITAMIN) tablet Take 1 tablet by mouth daily.     sertraline (ZOLOFT) 100 MG tablet Take 1.5 tablets (150 mg total) by mouth daily. 135 tablet 1   No current facility-administered medications for this visit.    Allergies  Allergen Reactions   Azithromycin Hives    Family History  Problem Relation Age of Onset   Arthritis Mother    Cancer Mother        cervical   Depression Mother    Diabetes Mother    Heart disease Mother    Hyperlipidemia Mother    Hypertension Mother    Stroke Mother    Depression Father    Ulcers Father    Depression Brother    Arthritis Maternal Grandmother    Asthma Maternal Grandmother    Cancer Maternal Grandmother        breast   Depression Maternal Grandmother    Diabetes Maternal Grandmother  Heart disease Maternal Grandmother    Hypertension Maternal Grandmother    Stroke Maternal Grandmother    Vision loss Maternal Grandmother    Arthritis Maternal Grandfather    Alcohol abuse Maternal Grandfather    Cancer Maternal Grandfather    Hypertension Maternal Grandfather    Stroke Maternal Grandfather    Heart attack Maternal Grandfather    Arthritis Paternal Grandmother    Diabetes Paternal Grandmother    Hypertension Paternal Grandmother    Vision loss Paternal Grandmother    Ovarian cancer Neg Hx    Colon cancer Neg Hx     Social History   Socioeconomic History   Marital status: Single    Spouse name: Not on file   Number of children: 3   Years of  education: College   Highest education level: Not on file  Occupational History   Occupation: [employer] Employee  Tobacco Use   Smoking status: Former    Current packs/day: 0.00    Average packs/day: 0.1 packs/day for 5.0 years (0.5 ttl pk-yrs)    Types: Cigarettes    Start date: 09/17/2013    Quit date: 09/17/2018    Years since quitting: 5.1   Smokeless tobacco: Never   Tobacco comments:    Quit prior 2 years, without treatment. Pt states she's planning quit on 04/19/18  Vaping Use   Vaping status: Never Used  Substance and Sexual Activity   Alcohol use: No   Drug use: No   Sexual activity: Not Currently    Birth control/protection: I.U.D.  Other Topics Concern   Not on file  Social History Narrative   Not on file   Social Drivers of Health   Financial Resource Strain: Not on file  Food Insecurity: Not on file  Transportation Needs: Not on file  Physical Activity: Not on file  Stress: Not on file  Social Connections: Not on file  Intimate Partner Violence: Not on file     Constitutional: Denies fever, malaise, fatigue, headache, or abrupt weight changes.  Respiratory: Pt reports cough. Denies difficulty breathing, shortness of breath.   Cardiovascular: Denies chest pain, chest tightness, palpitations or swelling in the hands or feet.  Musculoskeletal: Denies decrease in range of motion, difficulty with gait, muscle pain or joint pain and swelling.  Skin: Denies redness, rashes, lesions or ulcercations.  Neurological: Denies dizziness, difficulty with memory, difficulty with speech or problems with balance and coordination.  Psych: Patient has a history of anxiety and depression.  Denies SI/HI.  No other specific complaints in a complete review of systems (except as listed in HPI above).  Objective:   Physical Exam  BP 118/68 (BP Location: Right Arm, Patient Position: Sitting, Cuff Size: Large)   Ht 5\' 9"  (1.753 m)   Wt 277 lb 6.4 oz (125.8 kg)   BMI 40.96 kg/m     Wt Readings from Last 3 Encounters:  06/22/23 256 lb (116.1 kg)  12/09/22 252 lb (114.3 kg)  07/12/22 261 lb (118.4 kg)    General: Appears her stated age, obese, in NAD. Cardiovascular: Normal rate. Pulmonary/Chest: Normal effort. Neurological: Alert and oriented.  Psychiatric: Mood and affect mildly flat. Behavior is normal. Judgment and thought content normal.   BMET    Component Value Date/Time   NA 140 12/09/2022 1512   K 4.1 12/09/2022 1512   CL 105 12/09/2022 1512   CO2 23 12/09/2022 1512   GLUCOSE 84 12/09/2022 1512   BUN 3 (L) 12/09/2022 1512   CREATININE 0.67 12/09/2022  1512   CALCIUM 8.9 12/09/2022 1512   GFRNONAA 94 04/05/2017 1142   GFRNONAA 99 04/14/2016 0000   GFRAA 109 04/05/2017 1142   GFRAA 114 04/14/2016 0000    Lipid Panel     Component Value Date/Time   CHOL 113 12/09/2022 1512   CHOL 115 04/14/2016 0000   TRIG 38 12/09/2022 1512   TRIG 36 04/14/2016 0000   HDL 48 12/09/2022 1512   CHOLHDL 2.4 12/09/2022 1512   CHOLHDL 2.2 04/14/2016 0000   VLDL 7 04/14/2016 0000   LDLCALC 55 12/09/2022 1512   LDLCALC 56 04/14/2016 0000    CBC    Component Value Date/Time   WBC 4.9 12/09/2022 1512   WBC 7.0 04/26/2014 2052   RBC 3.93 12/09/2022 1512   RBC 4.04 04/26/2014 2052   HGB 11.1 12/09/2022 1512   HCT 32.6 (L) 12/09/2022 1512   PLT 319 12/09/2022 1512   MCV 83 12/09/2022 1512   MCV 75 (L) 04/26/2014 2052   MCH 28.2 12/09/2022 1512   MCH 24.1 (L) 04/26/2014 2052   MCHC 34.0 12/09/2022 1512   MCHC 32.1 04/26/2014 2052   RDW 11.9 12/09/2022 1512   RDW 14.3 04/26/2014 2052   LYMPHSABS 2.1 12/09/2022 1512   LYMPHSABS 1.1 04/26/2014 2052   MONOABS 0.7 04/26/2014 2052   EOSABS 0.4 12/09/2022 1512   EOSABS 0.1 04/26/2014 2052   BASOSABS 0.1 12/09/2022 1512   BASOSABS 0.0 04/26/2014 2052    Hgb A1C Lab Results  Component Value Date   HGBA1C 5.2 12/09/2022            Assessment & Plan:   Assessment and Plan    Generalized  Anxiety Disorder Significant anxiety during work hours, exacerbated by mother's death anniversary. Current medications include sertraline and hydroxyzine, with hydroxyzine effective as needed. Discussed adding buspirone for daytime anxiety management. Buspirone is non-addictive, non-drowsy, and suitable for her work schedule. - Prescribe buspirone 10 mg once daily, option for twice daily if needed. - Refill sertraline and hydroxyzine. - Advise hydroxyzine for acute panic, noting potential drowsiness.  Depression No worsening of depression. Monitoring mental health due to current stressors. - Continue sertraline daily.    RTC in 1 months, for your annual exam Nicki Reaper, NP

## 2023-11-01 NOTE — Patient Instructions (Signed)
 Managing Loss, Adult  People experience loss in many different ways throughout their lives. Events such as moving, changing jobs, and losing friends can create a sense of loss. The loss may be as serious as a major health change, divorce, death of a pet, or death of a loved one. All of these types of loss are likely to create a physical and emotional reaction known as grief. Grief is the result of a major change or an absence of something or someone that you count on. Grief is a normal reaction to loss.  A variety of factors can affect your grieving experience, including:  The nature of your loss.  Your relationship to what or whom you lost.  Your understanding of grief and how to manage it.  Your support system.  Be aware that when grief becomes extreme, it can lead to more severe issues like isolation, depression, anxiety, or suicidal thoughts. Talk with your health care provider if you have any of these issues.  How to manage lifestyle changes    Keep to your normal routine as much as possible.  If you have trouble focusing or doing normal activities, it is acceptable to take some time away from your normal routine.  Spend time with friends and loved ones.  Eat a healthy diet, get plenty of sleep, and rest when you feel tired.  How to recognize changes   The way that you deal with your grief will affect your ability to function as you normally do. When grieving, you may experience these changes:  Numbness, shock, sadness, anxiety, anger, denial, and guilt.  Thoughts about death.  Unexpected crying.  A physical sensation of emptiness in your stomach.  Problems sleeping and eating.  Tiredness (fatigue).  Loss of interest in normal activities.  Dreaming about or imagining seeing the person who died.  A need to remember what or whom you lost.  Difficulty thinking about anything other than your loss for a period of time.  Relief. If you have been expecting the loss for a while, you may feel a sense of relief when it  happens.  Follow these instructions at home:  Activity    Express your feelings in healthy ways, such as:  Talking with others about your loss. It may be helpful to find others who have had a similar loss, such as a support group.  Writing down your feelings in a journal.  Doing physical activities to release stress and emotional energy.  Doing creative activities like painting, sculpting, or playing or listening to music.  Practicing resilience. This is the ability to recover and adjust after facing challenges. Reading some resources that encourage resilience may help you to learn ways to practice those behaviors.     General instructions  Be patient with yourself and others. Allow the grieving process to happen, and remember that grieving takes time.  It is likely that you may never feel completely done with some grief. You may find a way to move on while still cherishing memories and feelings about your loss.  Accepting your loss is a process. It can take months or longer to adjust.  Keep all follow-up visits. This is important.  Where to find support  To get support for managing loss:  Ask your health care provider for help and recommendations, such as grief counseling or therapy.  Think about joining a support group for people who are managing a loss.  Where to find more information  You can find more information  about managing loss from:  American Society of Clinical Oncology: www.cancer.net  American Psychological Association: [employer]  Contact a health care provider if:  Your grief is extreme and keeps getting worse.  You have ongoing grief that does not improve.  Your body shows symptoms of grief, such as illness.  You feel depressed, anxious, or hopeless.  Get help right away if:  You have thoughts about hurting yourself or others.  Get help right away if you feel like you may hurt yourself or others, or have thoughts about taking your own life. Go to your nearest emergency room or:  Call 911.  Call the  National Suicide Prevention Lifeline at 985-787-5965 or 988. This is open 24 hours a day.  Text the Crisis Text Line at (308)798-1395.  Summary  Grief is the result of a major change or an absence of someone or something that you count on. Grief is a normal reaction to loss.  The depth of grief and the period of recovery depend on the type of loss and your ability to adjust to the change and process your feelings.  Processing grief requires patience and a willingness to accept your feelings and talk about your loss with people who are supportive.  It is important to find resources that work for you and to realize that people experience grief differently. There is not one grieving process that works for everyone in the same way.  Be aware that when grief becomes extreme, it can lead to more severe issues like isolation, depression, anxiety, or suicidal thoughts. Talk with your health care provider if you have any of these issues.  This information is not intended to replace advice given to you by your health care provider. Make sure you discuss any questions you have with your health care provider.  Document Revised: 03/22/2021 Document Reviewed: 03/22/2021  Elsevier Patient Education  2024 [employer].

## 2023-12-11 ENCOUNTER — Ambulatory Visit (INDEPENDENT_AMBULATORY_CARE_PROVIDER_SITE_OTHER): Payer: Managed Care, Other (non HMO) | Admitting: Internal Medicine

## 2023-12-11 VITALS — BP 112/72 | Ht 69.0 in | Wt 277.2 lb

## 2023-12-11 DIAGNOSIS — Z124 Encounter for screening for malignant neoplasm of cervix: Secondary | ICD-10-CM

## 2023-12-11 DIAGNOSIS — Z0001 Encounter for general adult medical examination with abnormal findings: Secondary | ICD-10-CM

## 2023-12-11 DIAGNOSIS — Z6841 Body Mass Index (BMI) 40.0 and over, adult: Secondary | ICD-10-CM

## 2023-12-11 DIAGNOSIS — E66813 Obesity, class 3: Secondary | ICD-10-CM

## 2023-12-11 MED ORDER — FLUTICASONE PROPIONATE 50 MCG/ACT NA SUSP: 2.0000 | Freq: Every day | NASAL | 6 refills | Status: AC

## 2023-12-11 NOTE — Progress Notes (Signed)
 Subjective:    Patient ID: Margaret Potter, female    DOB: 06-Jul-1986, 38 y.o.   MRN: [id number]  HPI  Patient presents to clinic today for her annual exam.  Flu: 06/2023 Tetanus: 05/2021 COVID: never Pap smear: 10/2018 Dentist: biannually  Diet: She is not eating meat. She consumes fruits and veggies. She does not eat fried foods. She drinks mostly water. Exercise: None  Review of Systems     Past Medical History:  Diagnosis Date   No known health problems     Current Outpatient Medications  Medication Sig Dispense Refill   Biotin 1 MG CAPS Take by mouth. (Patient not taking: Reported on 11/01/2023)     busPIRone  (BUSPAR ) 10 MG tablet Take 1 tablet (10 mg total) by mouth 2 (two) times daily as needed. 180 tablet 0   butalbital -acetaminophen-caffeine  (FIORICET) 50-325-40 MG tablet Take 1 tablet by mouth every 6 (six) hours as needed for headache. (Patient not taking: Reported on 11/01/2023) 14 tablet 2   Cholecalciferol (VITAMIN D3) 125 MCG (5000 UT) CAPS Take by mouth. (Patient not taking: Reported on 11/01/2023)     fluticasone  (FLONASE ) 50 MCG/ACT nasal spray Place 2 sprays into both nostrils daily. 16 g 6   hydrOXYzine  (ATARAX ) 10 MG tablet Take 1 tablet (10 mg total) by mouth daily as needed. 90 tablet 1   ipratropium (ATROVENT ) 0.06 % nasal spray Place 2 sprays into both nostrils 4 (four) times daily. For up to 5-7 days then stop. 15 mL 0   levonorgestrel  (MIRENA ) 20 MCG/24HR IUD 1 Intra Uterine Device (1 each total) by Intrauterine route once for 1 dose. 1 each 0   loratadine  (CLARITIN ) 10 MG tablet Take 1 tablet (10 mg total) by mouth daily as needed for allergies. 90 tablet 1   Multiple Vitamin (MULTIVITAMIN) tablet Take 1 tablet by mouth daily.     sertraline  (ZOLOFT ) 100 MG tablet Take 1.5 tablets (150 mg total) by mouth daily. 135 tablet 1   No current facility-administered medications for this visit.    Allergies  Allergen Reactions   Azithromycin  Hives     Family History  Problem Relation Age of Onset   Arthritis Mother    Cancer Mother        cervical   Depression Mother    Diabetes Mother    Heart disease Mother    Hyperlipidemia Mother    Hypertension Mother    Stroke Mother    Depression Father    Ulcers Father    Depression Brother    Arthritis Maternal Grandmother    Asthma Maternal Grandmother    Cancer Maternal Grandmother        breast   Depression Maternal Grandmother    Diabetes Maternal Grandmother    Heart disease Maternal Grandmother    Hypertension Maternal Grandmother    Stroke Maternal Grandmother    Vision loss Maternal Grandmother    Arthritis Maternal Grandfather    Alcohol abuse Maternal Grandfather    Cancer Maternal Grandfather    Hypertension Maternal Grandfather    Stroke Maternal Grandfather    Heart attack Maternal Grandfather    Arthritis Paternal Grandmother    Diabetes Paternal Grandmother    Hypertension Paternal Grandmother    Vision loss Paternal Grandmother    Ovarian cancer Neg Hx    Colon cancer Neg Hx     Social History   Socioeconomic History   Marital status: Single    Spouse name: Not on file   Number  of children: 3   Years of education: College   Highest education level: Not on file  Occupational History   Occupation: [employer] Employee  Tobacco Use   Smoking status: Former    Current packs/day: 0.00    Average packs/day: 0.1 packs/day for 5.0 years (0.5 ttl pk-yrs)    Types: Cigarettes    Start date: 09/17/2013    Quit date: 09/17/2018    Years since quitting: 5.2   Smokeless tobacco: Never   Tobacco comments:    Quit prior 2 years, without treatment. Pt states she's planning quit on 04/19/18  Vaping Use   Vaping status: Never Used  Substance and Sexual Activity   Alcohol use: No   Drug use: No   Sexual activity: Not Currently    Birth control/protection: I.U.D.  Other Topics Concern   Not on file  Social History Narrative   Not on file   Social Drivers of  Health   Financial Resource Strain: Not on file  Food Insecurity: Not on file  Transportation Needs: Not on file  Physical Activity: Not on file  Stress: Not on file  Social Connections: Not on file  Intimate Partner Violence: Not on file     Constitutional: Patient reports intermittent headaches.  Denies fever, malaise, fatigue, or abrupt weight changes.  HEENT: Denies eye pain, eye redness, ear pain, ringing in the ears, wax buildup, runny nose, nasal congestion, bloody nose, or sore throat. Respiratory: Denies difficulty breathing, shortness of breath, cough or sputum production.   Cardiovascular: Denies chest pain, chest tightness, palpitations or swelling in the hands or feet.  Gastrointestinal: Denies abdominal pain, bloating, constipation, diarrhea or blood in the stool.  GU:  Denies urgency, frequency, pain with urination, burning sensation, blood in urine, odor or discharge. Musculoskeletal: Denies decrease in range of motion, difficulty with gait, muscle pain or joint pain and swelling.  Skin: Denies redness, rashes, lesions or ulcercations.  Neurological: Denies dizziness, difficulty with memory, difficulty with speech or problems with balance and coordination.  Psych: Patient has a history of anxiety and depression.  Denies SI/HI.  No other specific complaints in a complete review of systems (except as listed in HPI above).  Objective:   Physical Exam  BP 112/72 (BP Location: Left Arm, Patient Position: Sitting, Cuff Size: Large)   Ht 5\' 9"  (1.753 m)   Wt 277 lb 3.2 oz (125.7 kg)   BMI 40.94 kg/m    Wt Readings from Last 3 Encounters:  11/01/23 277 lb 6.4 oz (125.8 kg)  06/22/23 256 lb (116.1 kg)  12/09/22 252 lb (114.3 kg)    General: Appears her stated age, obese, in NAD. Skin: Warm, dry and intact.  HEENT: Head: normal shape and size; Eyes: sclera white, no icterus, conjunctiva pink, PERRLA and EOMs intact; Neck:  Neck supple, trachea midline. No masses,  lumps or thyromegaly present.  Cardiovascular: Normal rate and rhythm. S1,S2 noted.  No murmur, rubs or gallops noted. No JVD or BLE edema. Pulmonary/Chest: Normal effort and positive vesicular breath sounds. No respiratory distress. No wheezes, rales or ronchi noted.  Abdomen:  Normal bowel sounds.  Pelvic: Normal female anatomy.  Cervix not well-visualized, moderate amount of discharge noted in the vaginal vault.  No CMT.  Adnexa nonpalpable. Musculoskeletal: Strength 5/5 BUE/BLE. No difficulty with gait.  Neurological: Alert and oriented. Cranial nerves II-XII grossly intact. Coordination normal.  Psychiatric: Mood and affect normal. Behavior is normal. Judgment and thought content normal.     BMET  Component Value Date/Time   NA 140 12/09/2022 1512   K 4.1 12/09/2022 1512   CL 105 12/09/2022 1512   CO2 23 12/09/2022 1512   GLUCOSE 84 12/09/2022 1512   BUN 3 (L) 12/09/2022 1512   CREATININE 0.67 12/09/2022 1512   CALCIUM 8.9 12/09/2022 1512   GFRNONAA 94 04/05/2017 1142   GFRNONAA 99 04/14/2016 0000   GFRAA 109 04/05/2017 1142   GFRAA 114 04/14/2016 0000    Lipid Panel     Component Value Date/Time   CHOL 113 12/09/2022 1512   CHOL 115 04/14/2016 0000   TRIG 38 12/09/2022 1512   TRIG 36 04/14/2016 0000   HDL 48 12/09/2022 1512   CHOLHDL 2.4 12/09/2022 1512   CHOLHDL 2.2 04/14/2016 0000   VLDL 7 04/14/2016 0000   LDLCALC 55 12/09/2022 1512   LDLCALC 56 04/14/2016 0000    CBC    Component Value Date/Time   WBC 4.9 12/09/2022 1512   WBC 7.0 04/26/2014 2052   RBC 3.93 12/09/2022 1512   RBC 4.04 04/26/2014 2052   HGB 11.1 12/09/2022 1512   HCT 32.6 (L) 12/09/2022 1512   PLT 319 12/09/2022 1512   MCV 83 12/09/2022 1512   MCV 75 (L) 04/26/2014 2052   MCH 28.2 12/09/2022 1512   MCH 24.1 (L) 04/26/2014 2052   MCHC 34.0 12/09/2022 1512   MCHC 32.1 04/26/2014 2052   RDW 11.9 12/09/2022 1512   RDW 14.3 04/26/2014 2052   LYMPHSABS 2.1 12/09/2022 1512   LYMPHSABS  1.1 04/26/2014 2052   MONOABS 0.7 04/26/2014 2052   EOSABS 0.4 12/09/2022 1512   EOSABS 0.1 04/26/2014 2052   BASOSABS 0.1 12/09/2022 1512   BASOSABS 0.0 04/26/2014 2052    Hgb A1C Lab Results  Component Value Date   HGBA1C 5.2 12/09/2022           Assessment & Plan:   Preventative Health Maintenance:  Encouraged her to get a flu shot in the fall Tetanus UTD Encouraged her to get her COVID-vaccine Pap smear today with STD screening Her to consume a balanced diet and exercise regimen Advised her to see an eye doctor and dentist annually We will check CBC, c-Met, lipid, A1c today  RTC in 6 months, follow-up chronic conditions Helayne Lo, NP

## 2023-12-11 NOTE — Assessment & Plan Note (Signed)
 Encouraged diet and exercise for weight

## 2023-12-11 NOTE — Patient Instructions (Signed)

## 2023-12-12 LAB — PAP LB (LIQUID-BASED)

## 2023-12-13 ENCOUNTER — Encounter: Payer: Self-pay | Admitting: Internal Medicine

## 2023-12-16 LAB — CBC
Hematocrit: 37.3 % (ref 34.0–46.6)
Hemoglobin: 12.1 g/dL (ref 11.1–15.9)
MCH: 27.7 pg (ref 26.6–33.0)
MCHC: 32.4 g/dL (ref 31.5–35.7)
MCV: 85 fL (ref 79–97)
Platelets: 369 10*3/uL (ref 150–450)
RBC: 4.37 x10E6/uL (ref 3.77–5.28)
RDW: 12.1 % (ref 11.7–15.4)
WBC: 4.4 10*3/uL (ref 3.4–10.8)

## 2023-12-16 LAB — LIPID PANEL
Chol/HDL Ratio: 3 ratio (ref 0.0–4.4)
Cholesterol, Total: 127 mg/dL (ref 100–199)
HDL: 43 mg/dL (ref >39)
LDL Chol Calc (NIH): 74 mg/dL (ref 0–99)
Triglycerides: 42 mg/dL (ref 0–149)
VLDL Cholesterol Cal: 10 mg/dL (ref 5–40)

## 2023-12-16 LAB — COMPREHENSIVE METABOLIC PANEL WITH GFR
ALT: 13 IU/L (ref 0–32)
AST: 17 IU/L (ref 0–40)
Albumin: 4.1 g/dL (ref 3.9–4.9)
Alkaline Phosphatase: 61 IU/L (ref 44–121)
BUN/Creatinine Ratio: 16 (ref 9–23)
BUN: 13 mg/dL (ref 6–20)
Bilirubin Total: 0.2 mg/dL (ref 0.0–1.2)
CO2: 23 mmol/L (ref 20–29)
Calcium: 9.1 mg/dL (ref 8.7–10.2)
Chloride: 107 mmol/L — ABNORMAL HIGH (ref 96–106)
Creatinine, Ser: 0.79 mg/dL (ref 0.57–1.00)
Globulin, Total: 2.6 g/dL (ref 1.5–4.5)
Glucose: 93 mg/dL (ref 70–99)
Potassium: 4.9 mmol/L (ref 3.5–5.2)
Sodium: 144 mmol/L (ref 134–144)
Total Protein: 6.7 g/dL (ref 6.0–8.5)
eGFR: 99 mL/min/{1.73_m2} (ref >59)

## 2023-12-16 LAB — HEMOGLOBIN A1C
Est. average glucose Bld gHb Est-mCnc: 105 mg/dL
Hgb A1c MFr Bld: 5.3 % (ref 4.8–5.6)

## 2024-02-03 ENCOUNTER — Other Ambulatory Visit: Payer: Self-pay | Admitting: Internal Medicine

## 2024-02-06 NOTE — Telephone Encounter (Signed)
 Requested Prescriptions  Pending Prescriptions Disp Refills   busPIRone  (BUSPAR ) 10 MG tablet [Pharmacy Med Name: BUSPIRONE  10MG  TABLETS] 180 tablet 1    Sig: TAKE 1 TABLET(10 MG) BY MOUTH TWICE DAILY AS NEEDED     Psychiatry: Anxiolytics/Hypnotics - Non-controlled Failed - 02/06/2024 12:05 PM      Failed - Valid encounter within last 12 months    Recent Outpatient Visits           1 month ago Encounter for general adult medical examination with abnormal findings   [place] [facility] [place], Angeline ORN, NP   3 months ago Anxiety and depression   [place] [facility] [place], Angeline ORN, [place]

## 2024-06-11 ENCOUNTER — Encounter: Payer: Self-pay | Admitting: Internal Medicine

## 2024-06-11 ENCOUNTER — Ambulatory Visit: Admitting: Internal Medicine

## 2024-06-11 VITALS — BP 114/82 | HR 97 | Ht 69.0 in | Wt 285.0 lb

## 2024-06-11 DIAGNOSIS — E66813 Obesity, class 3: Secondary | ICD-10-CM | POA: Diagnosis not present

## 2024-06-11 DIAGNOSIS — F32A Depression, unspecified: Secondary | ICD-10-CM | POA: Diagnosis not present

## 2024-06-11 DIAGNOSIS — Z6841 Body Mass Index (BMI) 40.0 and over, adult: Secondary | ICD-10-CM

## 2024-06-11 DIAGNOSIS — R519 Headache, unspecified: Secondary | ICD-10-CM | POA: Diagnosis not present

## 2024-06-11 DIAGNOSIS — F419 Anxiety disorder, unspecified: Secondary | ICD-10-CM

## 2024-06-11 DIAGNOSIS — Z23 Encounter for immunization: Secondary | ICD-10-CM

## 2024-06-11 MED ORDER — SERTRALINE HCL 100 MG PO TABS: 150.0000 mg | ORAL_TABLET | Freq: Every day | ORAL | 1 refills | Status: AC

## 2024-06-11 MED ORDER — BUTALBITAL-APAP-CAFFEINE 50-325-40 MG PO TABS: 1.0000 | ORAL_TABLET | Freq: Four times a day (QID) | ORAL | 2 refills | Status: AC | PRN

## 2024-06-11 MED ORDER — BUSPIRONE HCL 10 MG PO TABS: ORAL_TABLET | ORAL | 0 refills | Status: AC

## 2024-06-11 MED ORDER — HYDROXYZINE HCL 10 MG PO TABS: 10.0000 mg | ORAL_TABLET | Freq: Every day | ORAL | 1 refills | Status: AC | PRN

## 2024-06-11 NOTE — Assessment & Plan Note (Signed)
 Stable on sertraline  150 mg daily, buspirone  10 mg twice daily as needed and hydroxyzine  10 mg daily as needed Support offered

## 2024-06-11 NOTE — Assessment & Plan Note (Signed)
 Encouraged diet and exercise for weight

## 2024-06-11 NOTE — Assessment & Plan Note (Signed)
 Encouraged stress reduction techniques Continue fioricet 50-325-40 mg daily as needed

## 2024-06-11 NOTE — Progress Notes (Signed)
 Subjective:    Patient ID: Margaret Potter, female    DOB: [birth year], 38 y.o.   MRN: [id number]  HPI  Patient presents to clinic today for 29-month follow-up of chronic conditions.  Anxiety and depression: Chronic, managed on sertraline , buspirone  and hydroxyzine . She is not currently seeing a therapist.  She denies SI/HI.   Headaches: These occur once every few month.  Triggered by stress.  She takes fioricet as needed with some relief of symptoms.  She does not follow with neurology.    Review of Systems     Past Medical History:  Diagnosis Date   Allergy    Anxiety    Depression    No known health problems     Current Outpatient Medications  Medication Sig Dispense Refill   Biotin 1 MG CAPS Take by mouth.     busPIRone  (BUSPAR ) 10 MG tablet TAKE 1 TABLET(10 MG) BY MOUTH TWICE DAILY AS NEEDED 180 tablet 1   butalbital -acetaminophen-caffeine  (FIORICET) 50-325-40 MG tablet Take 1 tablet by mouth every 6 (six) hours as needed for headache. 14 tablet 2   Cholecalciferol (VITAMIN D3) 125 MCG (5000 UT) CAPS Take by mouth.     fluticasone  (FLONASE ) 50 MCG/ACT nasal spray Place 2 sprays into both nostrils daily. 16 g 6   hydrOXYzine  (ATARAX ) 10 MG tablet Take 1 tablet (10 mg total) by mouth daily as needed. 90 tablet 1   levonorgestrel  (MIRENA ) 20 MCG/24HR IUD 1 Intra Uterine Device (1 each total) by Intrauterine route once for 1 dose. 1 each 0   loratadine  (CLARITIN ) 10 MG tablet Take 1 tablet (10 mg total) by mouth daily as needed for allergies. 90 tablet 1   Multiple Vitamin (MULTIVITAMIN) tablet Take 1 tablet by mouth daily.     sertraline  (ZOLOFT ) 100 MG tablet Take 1.5 tablets (150 mg total) by mouth daily. 135 tablet 1   No current facility-administered medications for this visit.    Allergies  Allergen Reactions   Azithromycin  Hives    Family History  Problem Relation Age of Onset   Arthritis Mother    Cancer Mother        cervical   Depression Mother     Diabetes Mother    Heart disease Mother    Hyperlipidemia Mother    Hypertension Mother    Stroke Mother    Anxiety disorder Mother    Early death Mother    Obesity Mother    Depression Father    Ulcers Father    Depression Brother    Arthritis Maternal Grandmother    Asthma Maternal Grandmother    Cancer Maternal Grandmother        breast   Depression Maternal Grandmother    Diabetes Maternal Grandmother    Heart disease Maternal Grandmother    Hypertension Maternal Grandmother    Stroke Maternal Grandmother    Vision loss Maternal Grandmother    Varicose Veins Maternal Grandmother    Arthritis Maternal Grandfather    Alcohol abuse Maternal Grandfather    Cancer Maternal Grandfather    Hypertension Maternal Grandfather    Stroke Maternal Grandfather    Heart attack Maternal Grandfather    Arthritis Paternal Grandmother    Diabetes Paternal Grandmother    Hypertension Paternal Grandmother    Vision loss Paternal Grandmother    Obesity Maternal Aunt    Obesity Maternal Uncle    Obesity Daughter    Ovarian cancer Neg Hx    Colon cancer Neg Hx  Social History   Socioeconomic History   Marital status: Single    Spouse name: Not on file   Number of children: 3   Years of education: College   Highest education level: Some college, no degree  Occupational History   Occupation: Paramedic  Tobacco Use   Smoking status: Former    Current packs/day: 0.00    Average packs/day: 0.1 packs/day for 5.0 years (0.5 ttl pk-yrs)    Types: Cigarettes    Start date: 09/17/2013    Quit date: 09/17/2018    Years since quitting: 5.7   Smokeless tobacco: Never   Tobacco comments:    Quit prior 2 years, without treatment. Pt states she's planning quit on 04/19/18  Vaping Use   Vaping status: Never Used  Substance and Sexual Activity   Alcohol use: Not Currently    Comment: None at this time   Drug use: No   Sexual activity: Yes    Birth control/protection: I.U.D.  Other  Topics Concern   Not on file  Social History Narrative   Not on file   Social Drivers of Health   Financial Resource Strain: Low Risk  (12/11/2023)   Overall Financial Resource Strain (CARDIA)    Difficulty of Paying Living Expenses: Not very hard  Food Insecurity: Food Insecurity Present (12/11/2023)   Hunger Vital Sign    Worried About Running Out of Food in the Last Year: Sometimes true    Ran Out of Food in the Last Year: Sometimes true  Transportation Needs: No Transportation Needs (12/11/2023)   PRAPARE - Administrator, Civil Service (Medical): No    Lack of Transportation (Non-Medical): No  Physical Activity: Insufficiently Active (12/11/2023)   Exercise Vital Sign    Days of Exercise per Week: 1 day    Minutes of Exercise per Session: 30 min  Stress: No Stress Concern Present (12/11/2023)   [employer] of Occupational Health - Occupational Stress Questionnaire    Feeling of Stress : Not at all  Social Connections: Moderately Integrated (12/11/2023)   Social Connection and Isolation Panel    Frequency of Communication with Friends and Family: More than three times a week    Frequency of Social Gatherings with Friends and Family: Patient declined    Attends Religious Services: More than 4 times per year    Active Member of [employer] or Organizations: Yes    Attends Engineer, Structural: More than 4 times per year    Marital Status: Never married  Intimate Partner Violence: Not on file     Constitutional: Patient reports intermittent headaches.  Denies fever, malaise, fatigue, or abrupt weight changes.  HEENT: Denies eye pain, eye redness, ear pain, ringing in the ears, wax buildup, runny nose, runny nose, nasal congestion, bloody nose or sore throat. Respiratory: Denies difficulty breathing, shortness of breath, cough or sputum production.   Cardiovascular: Denies chest pain, chest tightness, palpitations or swelling in the hands or feet.   Gastrointestinal: Denies abdominal pain, bloating, constipation, diarrhea or blood in the stool.  GU: Denies urgency, frequency, pain with urination, burning sensation, blood in urine, odor or discharge. Musculoskeletal: Denies decrease in range of motion, difficulty with gait, muscle pain or joint pain and swelling.  Skin: Denies redness, rashes, lesions or ulcercations.  Neurological: Denies dizziness, difficulty with memory, difficulty with speech or problems with balance and coordination.  Psych: Patient has a history of anxiety and depression.  Denies SI/HI.  No other specific complaints in  a complete review of systems (except as listed in HPI above).  Objective:   Physical Exam  BP 114/82   Pulse 97   Ht 5' 9 (1.753 m)   Wt 285 lb (129.3 kg)   SpO2 100%   BMI 42.09 kg/m    Wt Readings from Last 3 Encounters:  12/11/23 277 lb 3.2 oz (125.7 kg)  11/01/23 277 lb 6.4 oz (125.8 kg)  06/22/23 256 lb (116.1 kg)    General: Appears her stated age, obese, in NAD. Skin: Warm, dry and intact.  HEENT: Head: normal shape and size; Eyes: sclera white, no icterus, conjunctiva pink, PERRLA and EOMs intact;  Cardiovascular: Normal rate and rhythm. S1,S2 noted.  No murmur, rubs or gallops noted. No JVD or BLE edema.  Pulmonary/Chest: Normal effort and positive vesicular breath sounds. No respiratory distress. No wheezes, rales or ronchi noted.  Musculoskeletal:  No difficulty with gait.  Neurological: Alert and oriented. Coordination normal.  Psychiatric: Mood and affect normal. Behavior is normal. Judgment and thought content normal.   BMET    Component Value Date/Time   NA 144 12/15/2023 0808   K 4.9 12/15/2023 0808   CL 107 (H) 12/15/2023 0808   CO2 23 12/15/2023 0808   GLUCOSE 93 12/15/2023 0808   BUN 13 12/15/2023 0808   CREATININE 0.79 12/15/2023 0808   CALCIUM 9.1 12/15/2023 0808   GFRNONAA 94 04/05/2017 1142   GFRNONAA 99 04/14/2016 0000   GFRAA 109 04/05/2017 1142    GFRAA 114 04/14/2016 0000    Lipid Panel     Component Value Date/Time   CHOL 127 12/15/2023 0808   CHOL 115 04/14/2016 0000   TRIG 42 12/15/2023 0808   TRIG 36 04/14/2016 0000   HDL 43 12/15/2023 0808   CHOLHDL 3.0 12/15/2023 0808   CHOLHDL 2.2 04/14/2016 0000   VLDL 7 04/14/2016 0000   LDLCALC 74 12/15/2023 0808   LDLCALC 56 04/14/2016 0000    CBC    Component Value Date/Time   WBC 4.4 12/15/2023 0808   WBC 7.0 04/26/2014 2052   RBC 4.37 12/15/2023 0808   RBC 4.04 04/26/2014 2052   HGB 12.1 12/15/2023 0808   HCT 37.3 12/15/2023 0808   PLT 369 12/15/2023 0808   MCV 85 12/15/2023 0808   MCV 75 (L) 04/26/2014 2052   MCH 27.7 12/15/2023 0808   MCH 24.1 (L) 04/26/2014 2052   MCHC 32.4 12/15/2023 0808   MCHC 32.1 04/26/2014 2052   RDW 12.1 12/15/2023 0808   RDW 14.3 04/26/2014 2052   LYMPHSABS 2.1 12/09/2022 1512   LYMPHSABS 1.1 04/26/2014 2052   MONOABS 0.7 04/26/2014 2052   EOSABS 0.4 12/09/2022 1512   EOSABS 0.1 04/26/2014 2052   BASOSABS 0.1 12/09/2022 1512   BASOSABS 0.0 04/26/2014 2052    Hgb A1C Lab Results  Component Value Date   HGBA1C 5.3 12/15/2023            Assessment & Plan:    RTC in 6 months, for your annual exam Angeline Laura, NP

## 2024-08-19 ENCOUNTER — Ambulatory Visit: Payer: Self-pay

## 2024-08-19 NOTE — Telephone Encounter (Signed)
 Message from Grant W sent at 08/19/2024  7:59 AM EST  Reason for Triage: Diarrhea + Nausea

## 2024-08-19 NOTE — Telephone Encounter (Signed)
" °  FYI Only or Action Required?: FYI only for provider: appointment scheduled on 01.08.26.  Patient was last seen in primary care on 06/11/2024 by Antonette Angeline ORN, NP.  Called Nurse Triage reporting Diarrhea.  Symptoms began several days ago.  Interventions attempted: OTC medications: Pepto.  Symptoms are: gradually worsening.  Triage Disposition: See Physician Within 24 Hours  Patient/caregiver understands and will follow disposition?: Yes  Message from [place] sent at 08/19/2024  7:59 AM EST  Reason for Triage: Diarrhea + Nausea   Reason for Disposition  [1] SEVERE diarrhea (e.g., 7 or more times / day more than normal) AND [2] present > 24 hours (1 day)  Answer Assessment - Initial Assessment Questions 1. DIARRHEA SEVERITY: How bad is the diarrhea? How many more stools have you had in the past 24 hours than normal?       Too many to count  2. ONSET: When did the diarrhea begin?      X 5 days  3. STOOL DESCRIPTION:  How loose or watery is the diarrhea? What is the stool color? Is there any blood or mucous in the stool?      Very watery  4. VOMITING: Are you also vomiting? If Yes, ask: How many times in the past 24 hours?       denies  5. ABDOMEN PAIN: Are you having any abdomen pain? If Yes, ask: What does it feel like? (e.g., crampy, dull, intermittent, constant)       denies  6. ABDOMEN PAIN SEVERITY: If present, ask: How bad is the pain?  (e.g., Scale 1-10; mild, moderate, or severe)      denies  7. ORAL INTAKE: If vomiting, Have you been able to drink liquids? How much liquids have you had in the past 24 hours?      Yes, fluids  8. HYDRATION: Pt denies dry mouth [not just dry lips], too weak to stand, dizziness, new weight loss), decreased urination        9. EXPOSURE: Have you traveled to a foreign country recently? Have you been exposed to anyone with diarrhea? Could you have eaten any food that was spoiled?      Pt's  granddaughter was sick prior to patient's onset of symtpoms  10. ANTIBIOTIC USE: Are you taking antibiotics now or have you taken antibiotics in the past 2 months?        Denies  11. OTHER SYMPTOMS: Do you have any other symptoms? (e.g., fever, blood in stool)  Nausea          Pt reports diarrhea and nausea Pt is taking OTC  Pepto Pt advised to try Imodium OTC in the meantime as she awaits her appt and to d/c Pepto Pt scheduled for a virtual visit with PCP on  01.08.26 for further evaluation. Pt agrees with plan of care, will call back for any worsening symptoms  Protocols used: Diarrhea-A-AH  "

## 2024-08-19 NOTE — Telephone Encounter (Signed)
"  Will discuss at upcoming appointment  "

## 2024-08-22 ENCOUNTER — Telehealth: Admitting: Internal Medicine

## 2024-08-22 DIAGNOSIS — F331 Major depressive disorder, recurrent, moderate: Secondary | ICD-10-CM | POA: Insufficient documentation

## 2024-08-22 DIAGNOSIS — R197 Diarrhea, unspecified: Secondary | ICD-10-CM

## 2024-08-22 DIAGNOSIS — R110 Nausea: Secondary | ICD-10-CM

## 2024-08-22 DIAGNOSIS — K219 Gastro-esophageal reflux disease without esophagitis: Secondary | ICD-10-CM

## 2024-08-22 MED ORDER — ONDANSETRON 4 MG PO TBDP: 4.0000 mg | ORAL_TABLET | Freq: Three times a day (TID) | ORAL | 0 refills | Status: AC | PRN

## 2024-08-22 NOTE — Progress Notes (Signed)
 Virtual Visit via Video Note  I connected with Margaret Potter on 08/22/2024 at 10:00 AM EST by a video enabled telemedicine application and verified that I am speaking with the correct person using two identifiers.  Location: Patient: Home Provider: Office  Person's participating in this video call: Angeline Laura, NP-C and Antoinetta Dutton   I discussed the limitations of evaluation and management by telemedicine and the availability of in person appointments. The patient expressed understanding and agreed to proceed.  History of Present Illness:   Discussed the use of AI scribe software for clinical note transcription with the patient, who gave verbal consent to proceed.  Margaret Potter is a 39 year old female who presents with nausea and diarrhea for eight days.  She has been experiencing intermittent nausea that worsens after meals, accompanied by diarrhea for the past eight days. There is no vomiting, but she reports heartburn and reflux, with an unusual episode of reflux the day before the visit. Mild cramping occurs before bowel movements, but there is no abdominal pain or blood in the stool.  The diarrhea is characterized by frequent bowel movements with a foul odor and initially pale yellowish color. She maintains hydration by sipping water and ginger ale throughout the day.  The symptoms began after eating at a [facility], followed by a fever over 100F the next day. She notes a slight improvement in symptoms since onset. She has not been on antibiotics recently and has no history of irritable bowel syndrome or family history of Crohn's disease or ulcerative colitis.  She has taken Pepto-Bismol and purchased Imodium but has not used it yet. She is forcing herself to eat despite the symptoms.      Past Medical History:  Diagnosis Date   Allergy    Anxiety    Depression    No known health problems     Current Outpatient Medications  Medication Sig Dispense  Refill   Ascorbic Acid (VITA-C PO) Take by mouth.     Biotin 1 MG CAPS Take by mouth.     busPIRone  (BUSPAR ) 10 MG tablet TAKE 1 TABLET(10 MG) BY MOUTH TWICE DAILY AS NEEDED 180 tablet 0   butalbital -acetaminophen-caffeine  (FIORICET) 50-325-40 MG tablet Take 1 tablet by mouth every 6 (six) hours as needed for headache. 14 tablet 2   Cholecalciferol (VITAMIN D3) 125 MCG (5000 UT) CAPS Take by mouth.     fluticasone  (FLONASE ) 50 MCG/ACT nasal spray Place 2 sprays into both nostrils daily. 16 g 6   hydrOXYzine  (ATARAX ) 10 MG tablet Take 1 tablet (10 mg total) by mouth daily as needed. 90 tablet 1   levonorgestrel  (MIRENA ) 20 MCG/24HR IUD 1 Intra Uterine Device (1 each total) by Intrauterine route once for 1 dose. 1 each 0   loratadine  (CLARITIN ) 10 MG tablet Take 1 tablet (10 mg total) by mouth daily as needed for allergies. 90 tablet 1   Multiple Vitamin (MULTIVITAMIN) tablet Take 1 tablet by mouth daily.     PREBIOTIC PRODUCT PO Take by mouth.     Probiotic Product (PROBIOTIC PO) Take by mouth.     sertraline  (ZOLOFT ) 100 MG tablet Take 1.5 tablets (150 mg total) by mouth daily. 135 tablet 1   No current facility-administered medications for this visit.    Allergies[1]  Family History  Problem Relation Age of Onset   Arthritis Mother    Cancer Mother        cervical   Depression Mother    Diabetes  Mother    Heart disease Mother    Hyperlipidemia Mother    Hypertension Mother    Stroke Mother    Anxiety disorder Mother    Early death Mother    Obesity Mother    Depression Father    Ulcers Father    Depression Brother    Arthritis Maternal Grandmother    Asthma Maternal Grandmother    Cancer Maternal Grandmother        breast   Depression Maternal Grandmother    Diabetes Maternal Grandmother    Heart disease Maternal Grandmother    Hypertension Maternal Grandmother    Stroke Maternal Grandmother    Vision loss Maternal Grandmother    Varicose Veins Maternal Grandmother     Arthritis Maternal Grandfather    Alcohol abuse Maternal Grandfather    Cancer Maternal Grandfather    Hypertension Maternal Grandfather    Stroke Maternal Grandfather    Heart attack Maternal Grandfather    Arthritis Paternal Grandmother    Diabetes Paternal Grandmother    Hypertension Paternal Grandmother    Vision loss Paternal Grandmother    Obesity Maternal Aunt    Obesity Maternal Uncle    Obesity Daughter    Ovarian cancer Neg Hx    Colon cancer Neg Hx     Social History   Socioeconomic History   Marital status: Single    Spouse name: Not on file   Number of children: 3   Years of education: College   Highest education level: Some college, no degree  Occupational History   Occupation: Paramedic  Tobacco Use   Smoking status: Former    Current packs/day: 0.00    Average packs/day: 0.1 packs/day for 5.0 years (0.5 ttl pk-yrs)    Types: Cigarettes    Start date: 09/17/2013    Quit date: 09/17/2018    Years since quitting: 5.9   Smokeless tobacco: Never   Tobacco comments:    Quit prior 2 years, without treatment. Pt states she's planning quit on 04/19/18  Vaping Use   Vaping status: Never Used  Substance and Sexual Activity   Alcohol use: Not Currently    Comment: None at this time   Drug use: No   Sexual activity: Yes    Birth control/protection: I.U.D.  Other Topics Concern   Not on file  Social History Narrative   Not on file   Social Drivers of Health   Tobacco Use: Medium Risk (06/11/2024)   Patient History    Smoking Tobacco Use: Former    Smokeless Tobacco Use: Never    Passive Exposure: Not on file  Financial Resource Strain: Low Risk (12/11/2023)   Overall Financial Resource Strain (CARDIA)    Difficulty of Paying Living Expenses: Not very hard  Food Insecurity: Food Insecurity Present (12/11/2023)   Hunger Vital Sign    Worried About Running Out of Food in the Last Year: Sometimes true    Ran Out of Food in the Last Year: Sometimes true   Transportation Needs: No Transportation Needs (12/11/2023)   PRAPARE - Administrator, Civil Service (Medical): No    Lack of Transportation (Non-Medical): No  Physical Activity: Insufficiently Active (12/11/2023)   Exercise Vital Sign    Days of Exercise per Week: 1 day    Minutes of Exercise per Session: 30 min  Stress: No Stress Concern Present (12/11/2023)   [employer] of Occupational Health - Occupational Stress Questionnaire    Feeling of Stress : Not at  all  Social Connections: Moderately Integrated (12/11/2023)   Social Connection and Isolation Panel    Frequency of Communication with Friends and Family: More than three times a week    Frequency of Social Gatherings with Friends and Family: Patient declined    Attends Religious Services: More than 4 times per year    Active Member of Clubs or Organizations: Yes    Attends Banker Meetings: More than 4 times per year    Marital Status: Never married  Intimate Partner Violence: Not At Risk (06/11/2024)   Epic    Fear of Current or Ex-Partner: No    Emotionally Abused: No    Physically Abused: No    Sexually Abused: No  Depression (PHQ2-9): Low Risk (06/11/2024)   Depression (PHQ2-9)    PHQ-2 Score: 4  Alcohol Screen: Low Risk (12/11/2023)   Alcohol Screen    Last Alcohol Screening Score (AUDIT): 1  Housing: High Risk (12/11/2023)   Housing Stability Vital Sign    Unable to Pay for Housing in the Last Year: Yes    Number of Times Moved in the Last Year: 0    Homeless in the Last Year: No  Utilities: Not At Risk (06/11/2024)   Epic    Threatened with loss of utilities: No  Health Literacy: Not on file     Constitutional: Patient reports intermittent headaches.  Denies fever, malaise, fatigue, or abrupt weight changes.  HEENT: Denies eye pain, eye redness, ear pain, ringing in the ears, wax buildup, runny nose, nasal congestion, bloody nose, or sore throat. Respiratory: Denies difficulty  breathing, shortness of breath, cough or sputum production.   Cardiovascular: Denies chest pain, chest tightness, palpitations or swelling in the hands or feet.  Gastrointestinal: Patient reports nausea and diarrhea.  Denies abdominal pain, bloating, constipation, or blood in the stool.  GU: Denies urgency, frequency, pain with urination, burning sensation, blood in urine, odor or discharge. Musculoskeletal: Denies decrease in range of motion, difficulty with gait, muscle pain or joint pain and swelling.  Skin: Denies redness, rashes, lesions or ulcercations.  Neurological: Denies dizziness, difficulty with memory, difficulty with speech or problems with balance and coordination.  Psych: Patient has a history of anxiety and depression.  Denies SI/HI.  No other specific complaints in a complete review of systems (except as listed in HPI above).  Observations/Objective:   Wt Readings from Last 3 Encounters:  06/11/24 285 lb (129.3 kg)  12/11/23 277 lb 3.2 oz (125.7 kg)  11/01/23 277 lb 6.4 oz (125.8 kg)    General: Appears her stated age, well developed, well nourished in NAD. Pulmonary/Chest: Normal effort. No respiratory distress.  Neurological: Alert and oriented.   BMET    Component Value Date/Time   NA 144 12/15/2023 0808   K 4.9 12/15/2023 0808   CL 107 (H) 12/15/2023 0808   CO2 23 12/15/2023 0808   GLUCOSE 93 12/15/2023 0808   BUN 13 12/15/2023 0808   CREATININE 0.79 12/15/2023 0808   CALCIUM 9.1 12/15/2023 0808   GFRNONAA 94 04/05/2017 1142   GFRNONAA 99 04/14/2016 0000   GFRAA 109 04/05/2017 1142   GFRAA 114 04/14/2016 0000    Lipid Panel     Component Value Date/Time   CHOL 127 12/15/2023 0808   CHOL 115 04/14/2016 0000   TRIG 42 12/15/2023 0808   TRIG 36 04/14/2016 0000   HDL 43 12/15/2023 0808   CHOLHDL 3.0 12/15/2023 0808   CHOLHDL 2.2 04/14/2016 0000   VLDL 7  04/14/2016 0000   LDLCALC 74 12/15/2023 0808   LDLCALC 56 04/14/2016 0000    CBC     Component Value Date/Time   WBC 4.4 12/15/2023 0808   WBC 7.0 04/26/2014 2052   RBC 4.37 12/15/2023 0808   RBC 4.04 04/26/2014 2052   HGB 12.1 12/15/2023 0808   HCT 37.3 12/15/2023 0808   PLT 369 12/15/2023 0808   MCV 85 12/15/2023 0808   MCV 75 (L) 04/26/2014 2052   MCH 27.7 12/15/2023 0808   MCH 24.1 (L) 04/26/2014 2052   MCHC 32.4 12/15/2023 0808   MCHC 32.1 04/26/2014 2052   RDW 12.1 12/15/2023 0808   RDW 14.3 04/26/2014 2052   LYMPHSABS 2.1 12/09/2022 1512   LYMPHSABS 1.1 04/26/2014 2052   MONOABS 0.7 04/26/2014 2052   EOSABS 0.4 12/09/2022 1512   EOSABS 0.1 04/26/2014 2052   BASOSABS 0.1 12/09/2022 1512   BASOSABS 0.0 04/26/2014 2052    Hgb A1C Lab Results  Component Value Date   HGBA1C 5.3 12/15/2023       Assessment and Plan:  Assessment and Plan    Acute infectious gastroenteritis, nausea Nausea and diarrhea for eight days, likely foodborne. Symptoms include nausea, diarrhea, occasional heartburn. Differential includes hepatitis A, B, salmonella, other infections. No recent antibiotics, reducing C. difficile likelihood. - Switch to Imodium: 2 mg after first loose stool, repeat in 1-2 hours, then twice daily. - Ordered CBC to check white count. - Ordered metabolic panel for liver and kidney function. - Ordered acute hepatitis panel. - Ordered GI pathogen panel including Legionella, Salmonella, C. difficile. - Prescribed Zofran  4 mg every 8 hours for nausea. - Advised hydration and thorough handwashing.  Gastroesophageal reflux disease Intermittent heartburn and reflux, recent episode noted. No typical reflux history.       RTC in 3 months for your annual exam  Follow Up Instructions:    I discussed the assessment and treatment plan with the patient. The patient was provided an opportunity to ask questions and all were answered. The patient agreed with the plan and demonstrated an understanding of the instructions.   The patient was advised to call  back or seek an in-person evaluation if the symptoms worsen or if the condition fails to improve as anticipated.   Angeline Laura, NP     [1]  Allergies Allergen Reactions   Azithromycin  Hives

## 2024-08-22 NOTE — Addendum Note (Signed)
 Addended by: ANTONETTE ANGELINE ORN on: 08/22/2024 10:26 AM   Modules accepted: Orders

## 2024-08-22 NOTE — Patient Instructions (Signed)
 Diarrhea, Adult Diarrhea is when you pass loose and sometimes watery poop (stool) often. Diarrhea can make you feel weak and cause you to lose water in your body (get dehydrated). Losing water in your body can cause you to: Feel tired and thirsty. Have a dry mouth. Go pee (urinate) less often. Diarrhea often lasts 2-3 days. It can last longer if it is a sign of something more serious. Be sure to treat your diarrhea as told by your doctor. Follow these instructions at home: Eating and drinking     Follow these instructions as told by your doctor: Take an ORS (oral rehydration solution). This is a drink that helps you replace fluids and minerals your body lost. It is sold at pharmacies and stores. Drink enough fluid to keep your pee (urine) pale yellow. Drink fluids such as: Water. You can also get fluids by sucking on ice chips. Diluted fruit juice. Low-calorie sports drinks. Milk. Avoid drinking fluids that have a lot of sugar or caffeine in them. These include soda, energy drinks, and regular sports drinks. Avoid alcohol. Eat bland, easy-to-digest foods in small amounts as you are able. These foods include: Bananas. Applesauce. Rice. Low-fat (lean) meats. Toast. Crackers. Avoid spicy or fatty foods.  Medicines Take over-the-counter and prescription medicines only as told by your doctor. If you were prescribed antibiotics, take them as told by your doctor. Do not stop taking them even if you start to feel better. General instructions  Wash your hands often using soap and water for 20 seconds. If soap and water are not available, use hand sanitizer. Others in your home should wash their hands as well. Wash your hands: After using the toilet or changing a diaper. Before preparing, cooking, or serving food. While caring for a sick person. While visiting someone in a hospital. Rest at home while you get better. Take a warm bath to help with any burning or pain from having  diarrhea. Watch your condition for any changes. Contact a doctor if: You have a fever. Your diarrhea gets worse. You have new symptoms. You vomit every time you eat or drink. You feel light-headed, dizzy, or you have a headache. You have muscle cramps. You have signs of losing too much water in your body, such as: Dark pee, very little pee, or no pee. Cracked lips. Dry mouth. Sunken eyes. Sleepiness. Weakness. You have bloody or black poop or poop that looks like tar. You have very bad pain, cramping, or bloating in your belly (abdomen). Your skin feels cold and clammy. You feel confused. Get help right away if: You have chest pain. Your heart is beating very quickly. You have trouble breathing or you are breathing very quickly. You feel very weak or you faint. These symptoms may be an emergency. Get help right away. Call 911. Do not wait to see if the symptoms will go away. Do not drive yourself to the hospital. This information is not intended to replace advice given to you by your health care provider. Make sure you discuss any questions you have with your health care provider. Document Revised: 01/18/2022 Document Reviewed: 01/18/2022 Elsevier Patient Education  2024 [employer].

## 2024-08-23 ENCOUNTER — Telehealth: Payer: Self-pay

## 2024-08-23 NOTE — Telephone Encounter (Signed)
 Copied from CRM [id number]. Topic: Clinical - Request for Lab/Test Order >> Aug 23, 2024  3:40 PM Margaret Potter wrote: Reason for CRM: Pt calling reports she was seen on yesterday with provider via virtual visit.  States an order was placed for lab GI testing, however per pt states she does not see the order.  Pt is also questioning if she should still get test done,as she at present is not having any symptoms discussed at visit, as they have subsided.  States if she needs to get labs requesting order/requisition be emailed to her at  [name]  Pt is aware of follow up call, can be reached at [phone].

## 2024-08-23 NOTE — Telephone Encounter (Signed)
 If improved, no reason to have labs drawn

## 2024-08-23 NOTE — Telephone Encounter (Signed)
 Left message for patient to return call OK to advise

## 2024-12-13 ENCOUNTER — Encounter: Admitting: Internal Medicine
